# Patient Record
Sex: Female | Born: 1975 | Race: White | Hispanic: No | Marital: Married | Smoking: Never smoker
Health system: Southern US, Community
[De-identification: ages and names within clinical notes are randomized; demographics above are authoritative.]

## PROBLEM LIST (undated history)

## (undated) DIAGNOSIS — F419 Anxiety disorder, unspecified: Secondary | ICD-10-CM

## (undated) DIAGNOSIS — F329 Major depressive disorder, single episode, unspecified: Secondary | ICD-10-CM

## (undated) DIAGNOSIS — F32A Depression, unspecified: Secondary | ICD-10-CM

## (undated) DIAGNOSIS — E079 Disorder of thyroid, unspecified: Secondary | ICD-10-CM

## (undated) DIAGNOSIS — R51 Headache: Secondary | ICD-10-CM

## (undated) HISTORY — DX: Depression, unspecified: F32.A

## (undated) HISTORY — DX: Disorder of thyroid, unspecified: E07.9

## (undated) HISTORY — DX: Headache: R51

## (undated) HISTORY — DX: Anxiety disorder, unspecified: F41.9

## (undated) HISTORY — DX: Major depressive disorder, single episode, unspecified: F32.9

---

## 2013-10-01 ENCOUNTER — Encounter (HOSPITAL_COMMUNITY): Payer: Self-pay | Admitting: Psychiatry

## 2013-10-01 ENCOUNTER — Encounter (INDEPENDENT_AMBULATORY_CARE_PROVIDER_SITE_OTHER): Payer: Self-pay

## 2013-10-01 ENCOUNTER — Ambulatory Visit (INDEPENDENT_AMBULATORY_CARE_PROVIDER_SITE_OTHER): Payer: 59 | Admitting: Psychiatry

## 2013-10-01 VITALS — BP 115/86 | HR 89 | Ht 67.0 in | Wt 203.0 lb

## 2013-10-01 DIAGNOSIS — F4001 Agoraphobia with panic disorder: Secondary | ICD-10-CM | POA: Insufficient documentation

## 2013-10-01 DIAGNOSIS — F4310 Post-traumatic stress disorder, unspecified: Secondary | ICD-10-CM

## 2013-10-01 DIAGNOSIS — F331 Major depressive disorder, recurrent, moderate: Secondary | ICD-10-CM | POA: Insufficient documentation

## 2013-10-01 MED ORDER — BUSPIRONE HCL 7.5 MG PO TABS: 7.5000 mg | ORAL_TABLET | Freq: Two times a day (BID) | ORAL | Status: DC

## 2013-10-01 MED ORDER — PAROXETINE HCL 30 MG PO TABS: ORAL_TABLET | ORAL | Status: DC
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
## 2013-10-01 NOTE — Patient Instructions (Signed)
Cut down prozac to 40mg  today and discontinue in next 7 to 10 days. You will start paxil 15mg  today and increase it to 30mg  tablet in the next 7 days.  Schedule for Individual therapy for anxiety and depression.

## 2013-10-06 ENCOUNTER — Ambulatory Visit (HOSPITAL_COMMUNITY): Payer: Self-pay | Admitting: Professional Counselor

## 2013-10-20 ENCOUNTER — Ambulatory Visit (INDEPENDENT_AMBULATORY_CARE_PROVIDER_SITE_OTHER): Payer: 59 | Admitting: Professional Counselor

## 2013-10-20 DIAGNOSIS — F4310 Post-traumatic stress disorder, unspecified: Secondary | ICD-10-CM
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
## 2013-10-29 ENCOUNTER — Ambulatory Visit (INDEPENDENT_AMBULATORY_CARE_PROVIDER_SITE_OTHER): Payer: 59 | Admitting: Psychiatry

## 2013-10-29 DIAGNOSIS — F331 Major depressive disorder, recurrent, moderate: Secondary | ICD-10-CM

## 2013-10-29 DIAGNOSIS — F4001 Agoraphobia with panic disorder: Secondary | ICD-10-CM

## 2013-10-29 DIAGNOSIS — F4310 Post-traumatic stress disorder, unspecified: Secondary | ICD-10-CM

## 2013-10-29 MED ORDER — PAROXETINE HCL 30 MG PO TABS: ORAL_TABLET | ORAL | Status: DC

## 2013-10-29 MED ORDER — BUSPIRONE HCL 7.5 MG PO TABS: 7.5000 mg | ORAL_TABLET | Freq: Two times a day (BID) | ORAL | Status: DC
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
## 2013-11-10 ENCOUNTER — Encounter (INDEPENDENT_AMBULATORY_CARE_PROVIDER_SITE_OTHER): Payer: Self-pay

## 2013-11-10 ENCOUNTER — Ambulatory Visit (INDEPENDENT_AMBULATORY_CARE_PROVIDER_SITE_OTHER): Payer: 59 | Admitting: Professional Counselor

## 2013-11-10 DIAGNOSIS — F4310 Post-traumatic stress disorder, unspecified: Secondary | ICD-10-CM

## 2013-11-11 ENCOUNTER — Encounter (HOSPITAL_COMMUNITY): Payer: Self-pay | Admitting: Professional Counselor

## 2013-11-11 NOTE — Progress Notes (Signed)
   THERAPIST PROGRESS NOTE  Session Time: 3:30pm-4:00pm  Participation Level: Active  Behavioral Response: CasualAlertAnxious  Type of Therapy: Individual Therapy  Treatment Goals addressed: Anxiety  Interventions: CBT and Solution Focused  Summary: Teresa Estrada is a 38 y.o. female who presents with anxious mood due to being 30 minutes late for appointment.  She reports the school bus schedule remains inconsistent and she apologized for being late. She reports her overall last few weeks have been stable, reporting no new stressors. Her medical problems continue to be overwhelming for her, but she was able to speak to the doctor and learn about her treatment plan.  She shares she practiced being assertive with the doctor in learning about her prognosis and began asking questions which is out of the norm for patient.  Her family life has improved greatly regarding the relationship with her children and husband.  Patient report she has been practicing many of the homework assignments and doing less isolating.  She reports she is trying hard to keep her thoughts positive and not irrational thinking the worst possible scenarios.   Overall session discussed irrational thought patterns, improving positive outcomes and additional interventions to improve overall wellness.  Suicidal/Homicidal: Nowithout intent/plan  Therapist Response: Christen's overall function level with anxiety and depression were assessed and stable currently. Patient reports less isolation, tearfulness, improve motivation, and less than 1 panic attack a week. She continues to work on controlling her emotions, not living in the past, and being more positive with future oriented problems or goals.  Her motivation to change is high due to patient wanting to be more active with her daughters and less stuck in her negative thoughts.  Plan: Return again in 2 weeks.  Diagnosis: Axis I: Post Traumatic Stress Disorder    Axis  II: Deferred    Raye Sorrow, LCSW 11/11/2013

## 2013-11-19 ENCOUNTER — Encounter (HOSPITAL_COMMUNITY): Payer: Self-pay | Admitting: Professional Counselor

## 2013-11-19 ENCOUNTER — Ambulatory Visit (INDEPENDENT_AMBULATORY_CARE_PROVIDER_SITE_OTHER): Payer: 59 | Admitting: Professional Counselor

## 2013-11-19 DIAGNOSIS — F4310 Post-traumatic stress disorder, unspecified: Secondary | ICD-10-CM

## 2013-11-19 NOTE — Progress Notes (Signed)
   THERAPIST PROGRESS NOTE  Session Time: 11:00am-12:00pm  Participation Level: Active  Behavioral Response: DisheveledAlertAnxious and tearful  Type of Therapy: Individual Therapy  Treatment Goals addressed: Anxiety  Interventions: DBT and Strength-based  Summary: Teresa Estrada is a 38 y.o. female who presents with disheveled affect along with anxiety as patient was observed shaking and tearful.  She processes her emotions around her extended family in [place] and how they continue to have control over her life, emotions, and outcomes.  Patient reports how family has been affecting her marriage and overall satisfaction in life as she has irrational thoughts of feeling responsible for outcomes of her extended family or guilt that she owes something to her siblings and parents. She processes feelings of being exhausted, frustrated and wanting to make a change and reasons associated/excuses as to why things cannot change.  DBT is used to help her understand distress tolerance and start using effective rethinking along with paired relaxation in effort to regain control over her thoughts and outcomes.  She was given homework to practice labeling her thoughts that lead to distress and in effort begin re-evaluating the situation by writing down effective positive thoughts that can replace stress and negative outcomes.     Suicidal/Homicidal: Nowithout intent/plan  Therapist Response: Teresa Estrada was very anxious and shaking upon entering for session.  This was observed through her behaviors but also per report. She is very self aware of what she has to do to take control back in her life, but her traumatic past and enmeshment with family stops her from making changes.  She is able to process feelings associated with negative comments from her mother or being a burden and how that affects her life today making it difficult to find closure to her past.  Willingness to change is high, but fear  bounds her to continuing the same behaviors.  Homework given to make her more aware of behaviors and thoughts in effort to find closure with extended family and allowing them to be less likely to control her thoughts.  Plan: Return again in 1 weeks.  Diagnosis: Axis I: Post Traumatic Stress Disorder    Axis II: Deferred    Raye Sorrow, LCSW 11/19/2013

## 2013-11-24 ENCOUNTER — Ambulatory Visit (HOSPITAL_COMMUNITY): Payer: 59 | Admitting: Professional Counselor

## 2013-11-24 ENCOUNTER — Encounter (INDEPENDENT_AMBULATORY_CARE_PROVIDER_SITE_OTHER): Payer: Self-pay

## 2013-12-31 ENCOUNTER — Encounter (INDEPENDENT_AMBULATORY_CARE_PROVIDER_SITE_OTHER): Payer: Self-pay

## 2013-12-31 ENCOUNTER — Encounter (HOSPITAL_COMMUNITY): Payer: Self-pay | Admitting: Psychiatry

## 2013-12-31 ENCOUNTER — Ambulatory Visit (INDEPENDENT_AMBULATORY_CARE_PROVIDER_SITE_OTHER): Payer: 59 | Admitting: Psychiatry

## 2013-12-31 VITALS — BP 122/90 | HR 81 | Wt 196.0 lb

## 2013-12-31 DIAGNOSIS — F331 Major depressive disorder, recurrent, moderate: Secondary | ICD-10-CM

## 2013-12-31 DIAGNOSIS — F4310 Post-traumatic stress disorder, unspecified: Secondary | ICD-10-CM

## 2013-12-31 DIAGNOSIS — F4001 Agoraphobia with panic disorder: Secondary | ICD-10-CM

## 2013-12-31 MED ORDER — PAROXETINE HCL 30 MG PO TABS: ORAL_TABLET | ORAL | Status: DC

## 2013-12-31 MED ORDER — BUSPIRONE HCL 7.5 MG PO TABS: ORAL_TABLET | ORAL | Status: DC
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
## 2014-03-02 ENCOUNTER — Ambulatory Visit (HOSPITAL_COMMUNITY): Payer: Self-pay | Admitting: Psychiatry

## 2014-03-16 ENCOUNTER — Other Ambulatory Visit (HOSPITAL_COMMUNITY): Payer: Self-pay | Admitting: Psychiatry

## 2014-03-23 ENCOUNTER — Encounter (INDEPENDENT_AMBULATORY_CARE_PROVIDER_SITE_OTHER): Payer: Self-pay

## 2014-03-23 ENCOUNTER — Encounter (HOSPITAL_COMMUNITY): Payer: Self-pay | Admitting: Psychiatry

## 2014-03-23 ENCOUNTER — Ambulatory Visit (INDEPENDENT_AMBULATORY_CARE_PROVIDER_SITE_OTHER): Payer: 59 | Admitting: Psychiatry

## 2014-03-23 VITALS — BP 113/83 | HR 100 | Ht 67.0 in | Wt 209.0 lb

## 2014-03-23 DIAGNOSIS — F4001 Agoraphobia with panic disorder: Secondary | ICD-10-CM

## 2014-03-23 DIAGNOSIS — F4310 Post-traumatic stress disorder, unspecified: Secondary | ICD-10-CM

## 2014-03-23 DIAGNOSIS — F331 Major depressive disorder, recurrent, moderate: Secondary | ICD-10-CM

## 2014-03-23 MED ORDER — BUSPIRONE HCL 7.5 MG PO TABS: ORAL_TABLET | ORAL | Status: DC

## 2014-03-23 MED ORDER — PAROXETINE HCL 30 MG PO TABS: ORAL_TABLET | ORAL | Status: DC
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
## 2014-04-20 ENCOUNTER — Other Ambulatory Visit (HOSPITAL_COMMUNITY): Payer: Self-pay | Admitting: Psychiatry

## 2014-05-24 ENCOUNTER — Ambulatory Visit (HOSPITAL_COMMUNITY): Payer: Self-pay | Admitting: Psychiatry

## 2014-05-31 ENCOUNTER — Encounter (INDEPENDENT_AMBULATORY_CARE_PROVIDER_SITE_OTHER): Payer: Self-pay

## 2014-05-31 ENCOUNTER — Encounter (HOSPITAL_COMMUNITY): Payer: Self-pay | Admitting: Psychiatry

## 2014-05-31 ENCOUNTER — Ambulatory Visit (INDEPENDENT_AMBULATORY_CARE_PROVIDER_SITE_OTHER): Payer: 59 | Admitting: Psychiatry

## 2014-05-31 VITALS — BP 139/84 | HR 104 | Ht 67.0 in | Wt 211.0 lb

## 2014-05-31 DIAGNOSIS — F331 Major depressive disorder, recurrent, moderate: Secondary | ICD-10-CM

## 2014-05-31 DIAGNOSIS — F4310 Post-traumatic stress disorder, unspecified: Secondary | ICD-10-CM

## 2014-05-31 DIAGNOSIS — F4001 Agoraphobia with panic disorder: Secondary | ICD-10-CM

## 2014-05-31 MED ORDER — PAROXETINE HCL 40 MG PO TABS: ORAL_TABLET | ORAL | Status: DC

## 2014-05-31 MED ORDER — BUSPIRONE HCL 10 MG PO TABS: ORAL_TABLET | ORAL | Status: DC

## 2014-05-31 MED ORDER — CLONAZEPAM 0.5 MG PO TABS
0.5000 mg | ORAL_TABLET | Freq: Every day | ORAL | Status: DC | PRN
Start: 2014-05-31 — End: 2014-06-21
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
## 2014-06-21 ENCOUNTER — Encounter (HOSPITAL_COMMUNITY): Payer: Self-pay | Admitting: Psychiatry

## 2014-06-21 ENCOUNTER — Ambulatory Visit (INDEPENDENT_AMBULATORY_CARE_PROVIDER_SITE_OTHER): Payer: 59 | Admitting: Psychiatry

## 2014-06-21 VITALS — BP 124/89 | HR 86 | Ht 67.0 in | Wt 208.0 lb

## 2014-06-21 DIAGNOSIS — F331 Major depressive disorder, recurrent, moderate: Secondary | ICD-10-CM | POA: Diagnosis not present

## 2014-06-21 DIAGNOSIS — F4001 Agoraphobia with panic disorder: Secondary | ICD-10-CM | POA: Diagnosis not present

## 2014-06-21 DIAGNOSIS — F4310 Post-traumatic stress disorder, unspecified: Secondary | ICD-10-CM | POA: Diagnosis not present

## 2014-06-21 MED ORDER — CLONAZEPAM 0.5 MG PO TABS: 0.5000 mg | ORAL_TABLET | Freq: Every day | ORAL | Status: DC | PRN

## 2014-06-21 MED ORDER — PAROXETINE HCL 40 MG PO TABS: ORAL_TABLET | ORAL | Status: DC

## 2014-06-21 MED ORDER — BUSPIRONE HCL 10 MG PO TABS: ORAL_TABLET | ORAL | Status: DC
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
## 2014-07-22 ENCOUNTER — Ambulatory Visit (HOSPITAL_COMMUNITY): Payer: Self-pay | Admitting: Psychiatry

## 2014-07-23 ENCOUNTER — Ambulatory Visit (HOSPITAL_COMMUNITY): Payer: Self-pay | Admitting: Licensed Clinical Social Worker

## 2014-07-30 ENCOUNTER — Ambulatory Visit (INDEPENDENT_AMBULATORY_CARE_PROVIDER_SITE_OTHER): Payer: 59 | Admitting: Psychiatry

## 2014-07-30 ENCOUNTER — Encounter (HOSPITAL_COMMUNITY): Payer: Self-pay | Admitting: Psychiatry

## 2014-07-30 VITALS — Ht 67.0 in | Wt 208.0 lb

## 2014-07-30 DIAGNOSIS — F4310 Post-traumatic stress disorder, unspecified: Secondary | ICD-10-CM | POA: Diagnosis not present

## 2014-07-30 DIAGNOSIS — F4001 Agoraphobia with panic disorder: Secondary | ICD-10-CM

## 2014-07-30 DIAGNOSIS — F331 Major depressive disorder, recurrent, moderate: Secondary | ICD-10-CM | POA: Diagnosis not present

## 2014-07-30 MED ORDER — CLONAZEPAM 0.5 MG PO TABS: 0.5000 mg | ORAL_TABLET | Freq: Every day | ORAL | Status: DC | PRN

## 2014-07-30 MED ORDER — PAROXETINE HCL 40 MG PO TABS: ORAL_TABLET | ORAL | Status: DC

## 2014-07-30 MED ORDER — BUSPIRONE HCL 10 MG PO TABS: ORAL_TABLET | ORAL | Status: DC
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
## 2014-08-26 ENCOUNTER — Encounter (HOSPITAL_COMMUNITY): Payer: Self-pay | Admitting: Licensed Clinical Social Worker

## 2014-08-26 ENCOUNTER — Ambulatory Visit (INDEPENDENT_AMBULATORY_CARE_PROVIDER_SITE_OTHER): Payer: 59 | Admitting: Licensed Clinical Social Worker

## 2014-08-26 DIAGNOSIS — F4310 Post-traumatic stress disorder, unspecified: Secondary | ICD-10-CM

## 2014-08-26 DIAGNOSIS — F411 Generalized anxiety disorder: Secondary | ICD-10-CM | POA: Insufficient documentation

## 2014-08-26 DIAGNOSIS — F410 Panic disorder [episodic paroxysmal anxiety] without agoraphobia: Secondary | ICD-10-CM | POA: Insufficient documentation

## 2014-08-26 DIAGNOSIS — F331 Major depressive disorder, recurrent, moderate: Secondary | ICD-10-CM | POA: Diagnosis not present
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
## 2014-08-30 ENCOUNTER — Ambulatory Visit (INDEPENDENT_AMBULATORY_CARE_PROVIDER_SITE_OTHER): Payer: 59 | Admitting: Psychiatry

## 2014-08-30 ENCOUNTER — Encounter (HOSPITAL_COMMUNITY): Payer: Self-pay | Admitting: Psychiatry

## 2014-08-30 VITALS — BP 110/80 | HR 80 | Ht 67.0 in | Wt 208.0 lb

## 2014-08-30 DIAGNOSIS — F410 Panic disorder [episodic paroxysmal anxiety] without agoraphobia: Secondary | ICD-10-CM

## 2014-08-30 DIAGNOSIS — F331 Major depressive disorder, recurrent, moderate: Secondary | ICD-10-CM | POA: Diagnosis not present

## 2014-08-30 DIAGNOSIS — F4310 Post-traumatic stress disorder, unspecified: Secondary | ICD-10-CM | POA: Diagnosis not present

## 2014-08-30 DIAGNOSIS — F411 Generalized anxiety disorder: Secondary | ICD-10-CM

## 2014-08-30 DIAGNOSIS — F4001 Agoraphobia with panic disorder: Secondary | ICD-10-CM | POA: Diagnosis not present

## 2014-08-30 MED ORDER — PAROXETINE HCL 40 MG PO TABS: ORAL_TABLET | ORAL | Status: DC

## 2014-08-30 MED ORDER — CLONAZEPAM 0.5 MG PO TABS: 0.5000 mg | ORAL_TABLET | Freq: Every day | ORAL | Status: DC | PRN

## 2014-08-30 MED ORDER — BUSPIRONE HCL 10 MG PO TABS: ORAL_TABLET | ORAL | Status: DC
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
## 2014-08-31 ENCOUNTER — Other Ambulatory Visit (HOSPITAL_COMMUNITY): Payer: Self-pay | Admitting: Psychiatry

## 2014-09-02 NOTE — Telephone Encounter (Signed)
Rx for Paxil 40mg   was sent to pharmacy on 6/13.

## 2014-10-06 ENCOUNTER — Ambulatory Visit (HOSPITAL_COMMUNITY): Payer: Self-pay | Admitting: Licensed Clinical Social Worker

## 2014-10-11 ENCOUNTER — Ambulatory Visit (HOSPITAL_COMMUNITY): Payer: Self-pay | Admitting: Psychiatry

## 2014-10-21 ENCOUNTER — Ambulatory Visit (INDEPENDENT_AMBULATORY_CARE_PROVIDER_SITE_OTHER): Payer: 59 | Admitting: Licensed Clinical Social Worker

## 2014-10-21 DIAGNOSIS — F411 Generalized anxiety disorder: Secondary | ICD-10-CM | POA: Diagnosis not present

## 2014-10-21 DIAGNOSIS — F331 Major depressive disorder, recurrent, moderate: Secondary | ICD-10-CM | POA: Diagnosis not present

## 2014-10-21 DIAGNOSIS — F410 Panic disorder [episodic paroxysmal anxiety] without agoraphobia: Secondary | ICD-10-CM

## 2014-10-21 DIAGNOSIS — F4310 Post-traumatic stress disorder, unspecified: Secondary | ICD-10-CM

## 2014-10-21 NOTE — Progress Notes (Signed)
   THERAPIST PROGRESS NOTE  Session Time: 9:00-10:05am  Participation Level: Active  Behavioral Response: CasualAlertAnxious and Tearful   Type of Therapy: Individual Therapy  Treatment Goals addressed: coping with anxiety  Interventions: psycho-education about effects of trauma   Suicidal/Homicidal: Denied both  Therapist Interventions: Had patient fill out a PHQ-9 and GAD-7 to assess for depression and anxiety.   Discussed recent stressors which have interfered with being able to come to therapy sessions. Learned details about patient's history with her family.   Emphasized that when you go through stressful or traumatic situations it is normal to have distressing emotions.  Summary: PHQ-9 score was 16 (moderately severe).  This is a slight improvement compared to her score in early June. GAD-7 score was 16 (severe).  This is also a slight improvement.     Reported she was unable to attend appointments last month because she had to go to [place] to be with family.  Her grandfather was ill and shortly after her uncle died.   Talked about how she has always been deeply connected with her family despite their dysfunctional behavior.  Explained how she feels a tremendous amount of guilt if she is not there for her family.  Others have encouraged her to cut ties with them, but she places a lot of value on family.   Indicated she appreciated having an opportunity to share her thoughts and feelings about different events in her life and have someone simply listen to what she had to say.   Agreed that validation is something she needs from loved ones but often doesn't receive.          Plan: Return again in approximately 2 weeks.    Diagnosis: F41.1 Generalized Anxiety Disorder                     F33.1 Major Depressive Disorder, recurrent, moderate                     F43.1 PTSD                     F41.0  Panic Disorder     Marilu Favre, LCSW 10/21/2014

## 2014-10-29 ENCOUNTER — Ambulatory Visit (INDEPENDENT_AMBULATORY_CARE_PROVIDER_SITE_OTHER): Payer: 59 | Admitting: Psychiatry

## 2014-10-29 ENCOUNTER — Encounter (HOSPITAL_COMMUNITY): Payer: Self-pay | Admitting: Psychiatry

## 2014-10-29 VITALS — BP 128/82 | HR 90 | Ht 67.0 in | Wt 208.0 lb

## 2014-10-29 DIAGNOSIS — F4001 Agoraphobia with panic disorder: Secondary | ICD-10-CM | POA: Diagnosis not present

## 2014-10-29 DIAGNOSIS — F410 Panic disorder [episodic paroxysmal anxiety] without agoraphobia: Secondary | ICD-10-CM

## 2014-10-29 DIAGNOSIS — F331 Major depressive disorder, recurrent, moderate: Secondary | ICD-10-CM | POA: Diagnosis not present

## 2014-10-29 DIAGNOSIS — F4310 Post-traumatic stress disorder, unspecified: Secondary | ICD-10-CM

## 2014-10-29 DIAGNOSIS — F411 Generalized anxiety disorder: Secondary | ICD-10-CM

## 2014-10-29 MED ORDER — CLONAZEPAM 0.5 MG PO TABS: 0.5000 mg | ORAL_TABLET | Freq: Every day | ORAL | Status: DC | PRN

## 2014-10-29 MED ORDER — BUSPIRONE HCL 10 MG PO TABS: ORAL_TABLET | ORAL | Status: DC

## 2014-10-29 MED ORDER — PAROXETINE HCL 40 MG PO TABS: ORAL_TABLET | ORAL | Status: DC
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
## 2014-11-03 ENCOUNTER — Telehealth (HOSPITAL_COMMUNITY): Payer: Self-pay | Admitting: *Deleted

## 2014-11-03 MED ORDER — BUSPIRONE HCL 10 MG PO TABS: ORAL_TABLET | ORAL | Status: DC

## 2014-11-03 MED ORDER — PAROXETINE HCL 40 MG PO TABS: ORAL_TABLET | ORAL | Status: DC

## 2014-11-03 NOTE — Telephone Encounter (Signed)
Received fax from [facility] for refills for Buspar  and Paxil . Per Dr. Gilmore Laroche, pt is authorized for a refill for Buspar , Qty 90 w/ 1 additional refill and Paxil , Qty 30 with 1 additional refill. Prescriptions were resubmitted to [facility] ([address]) PT has a f/u appt on 12/28/14.

## 2014-11-04 ENCOUNTER — Ambulatory Visit (HOSPITAL_COMMUNITY): Payer: Self-pay | Admitting: Licensed Clinical Social Worker

## 2014-11-17 ENCOUNTER — Encounter: Payer: Self-pay | Admitting: Emergency Medicine

## 2014-11-17 ENCOUNTER — Emergency Department (INDEPENDENT_AMBULATORY_CARE_PROVIDER_SITE_OTHER)
Admission: EM | Admit: 2014-11-17 | Discharge: 2014-11-17 | Disposition: A | Discharge status: Home / Self Care | Payer: 59 | Source: Home / Self Care | Attending: Family Medicine | Admitting: Family Medicine

## 2014-11-17 ENCOUNTER — Emergency Department (INDEPENDENT_AMBULATORY_CARE_PROVIDER_SITE_OTHER): Payer: 59

## 2014-11-17 DIAGNOSIS — M25532 Pain in left wrist: Secondary | ICD-10-CM

## 2014-11-17 DIAGNOSIS — W108XXA Fall (on) (from) other stairs and steps, initial encounter: Secondary | ICD-10-CM

## 2014-11-17 DIAGNOSIS — M79642 Pain in left hand: Secondary | ICD-10-CM | POA: Diagnosis not present

## 2014-11-17 DIAGNOSIS — M542 Cervicalgia: Secondary | ICD-10-CM

## 2014-11-17 MED ORDER — IBUPROFEN 600 MG PO TABS: 600.0000 mg | ORAL_TABLET | Freq: Four times a day (QID) | ORAL | Status: DC | PRN

## 2014-11-17 MED ORDER — HYDROCODONE-ACETAMINOPHEN 5-325 MG PO TABS: 1.0000 | ORAL_TABLET | Freq: Four times a day (QID) | ORAL | Status: DC | PRN

## 2014-11-17 NOTE — Discharge Instructions (Signed)
Vicodin (hydrocodone-acetaminophen) is a narcotic pain medication, do not combine these medications with others containing tylenol. While taking, do not drink alcohol, drive, or perform any other activities that requires focus while taking these medications.  ° ° °

## 2014-11-17 NOTE — ED Provider Notes (Signed)
CSN: [id number]     Arrival date & time 11/17/14  1719 History   First MD Initiated Contact with Patient 11/17/14 1722     Chief Complaint  Patient presents with  . Arm Pain   (Consider location/radiation/quality/duration/timing/severity/associated sxs/prior Treatment) HPI  Pt is a 39yo female presenting to [facility] with c/o Left sided neck pain and Left wrist and hand pain after slip and fall down carpet steps in her house about 30 minutes PTA. Pt states she did hit her head but denies LOC. Denies change in vision, balance or nausea. Pt c/o severe Left wrist pain that is worse with movement and palpation. Pt also c/o Left sided neck pain that is aching and sore.  Denies numbness or tingling in arms or legs. No pain medication taken PTA. Pain has gradually worsened since onset. Denies back pain. Denies any other injuries.  Past Medical History  Diagnosis Date  . Anxiety   . Depression   . Thyroid disease   . Headache(784.0)    History reviewed. No pertinent past surgical history. Family History  Problem Relation Age of Onset  . Depression Father   . Alcohol abuse Father   . Anxiety disorder Paternal Aunt   . Depression Paternal Aunt    Social History  Substance Use Topics  . Smoking status: Never Smoker   . Smokeless tobacco: None     Comment: NA  . Alcohol Use: No   OB History    No data available     Review of Systems  Cardiovascular: Negative for chest pain and palpitations.  Gastrointestinal: Negative for nausea, vomiting and abdominal pain.  Musculoskeletal: Positive for myalgias, arthralgias and neck pain. Negative for back pain, joint swelling and neck stiffness.  Skin: Negative for color change and wound.  Neurological: Negative for weakness and numbness.    Allergies  Review of patient's allergies indicates no known allergies.  Home Medications   Prior to Admission medications   Medication Sig Start Date End Date Taking? Authorizing Provider  clonazePAM  (KLONOPIN) 0.5 MG tablet Take 1 tablet (0.5 mg total) by mouth daily as needed for anxiety. 10/29/14   [name], MD  HYDROcodone-acetaminophen (NORCO/VICODIN) 5-325 MG per tablet Take 1-2 tablets by mouth every 6 (six) hours as needed for moderate pain or severe pain. 11/17/14   Junius Finner, PA-C  ibuprofen (ADVIL,MOTRIN) 600 MG tablet Take 1 tablet (600 mg total) by mouth every 6 (six) hours as needed. 11/17/14   Junius Finner, PA-C  levothyroxine (SYNTHROID, LEVOTHROID) 100 MCG tablet  09/30/13   Historical Provider, MD  PARoxetine (PAXIL) 40 MG tablet Take one tablet mid day. 11/03/14   [name], MD   Meds Ordered and Administered this Visit  Medications - No data to display  BP 133/94 mmHg  Pulse 89  Temp(Src) 98.3 F (36.8 C) (Oral)  Ht 5\' 6"  (1.676 m)  Wt 215 lb (97.523 kg)  BMI 34.72 kg/m2  SpO2 98%  LMP 10/25/2014 (Exact Date) No data found.   Physical Exam  Constitutional: She is oriented to person, place, and time. She appears well-developed and well-nourished.  HENT:  Head: Normocephalic and atraumatic.  Eyes: EOM are normal.  Neck: Normal range of motion. Neck supple.  No midline bone tenderness, no crepitus or step-offs.  Mild tenderness to muscles on Left lateral side of neck.  Cardiovascular: Normal rate.   Pulses:      Radial pulses are 2+ on the left side.  Pulmonary/Chest: Effort normal.  Musculoskeletal: She  exhibits tenderness. She exhibits no edema.  Left shoulder and elbow: no deformity, FROM w/o pain. Left wrist: no deformity or edema, tenderness to radial aspect a base of thumb into snuffbox and dorsal radial aspect of wrist. Limited flexion and extension of wrist due to pain. FROM all digits. Able to oppose thumb and little finger.   No midline spinal tenderness FROM Right arm and bilateral lower extremities with 5/5 strength.  Neurological: She is alert and oriented to person, place, and time.  Sensation normal in upper and lower extremities  bilaterally.  Skin: Skin is warm and dry.  Left hand and wrist: skin in tact, no ecchymosis or erythema  Psychiatric: She has a normal mood and affect. Her behavior is normal.  Nursing note and vitals reviewed.   ED Course  Procedures    Fiberglass thumb spica wrist splint applied to Left splint by this provided. Fingers and Left hand neurovascularly in tact before and after splint placement.   Labs Review Labs Reviewed - No data to display  Imaging Review Dg Wrist Complete Left  11/17/2014   CLINICAL DATA:  Fall down stairs. Left wrist injury and pain. Initial encounter.  EXAM: LEFT WRIST - COMPLETE 3+ VIEW  COMPARISON:  None.  FINDINGS: There is no evidence of fracture or dislocation. There is no evidence of arthropathy or other focal bone abnormality. Soft tissues are unremarkable.  IMPRESSION: Negative.   Electronically Signed   By: Myles Rosenthal M.D.   On: 11/17/2014 18:01   Dg Hand Complete Left  11/17/2014   CLINICAL DATA:  Left hand and wrist pain after falling down stairs.  EXAM: LEFT HAND - COMPLETE 3+ VIEW  COMPARISON:  None.  FINDINGS: There is no evidence of fracture or dislocation. There is no evidence of arthropathy or other focal bone abnormality. Soft tissues are unremarkable.  IMPRESSION: Negative.   Electronically Signed   By: Elberta Fortis M.D.   On: 11/17/2014 18:01       MDM   1. Left wrist pain   2. Fall down stairs, initial encounter   3. Neck pain on left side    Pt c/o Left sided neck pain and Left hand and wrist pain after fall down stairs PTA. No red flag symptoms. No spinal tenderness.  Left hand is neurovascularly in tact, however, tenderness present in anatomic snuff box and severe pain with flexion and extension of wrist. Plain films negative for fracture or dislocation. Concern for occult scaphoid fracture given pt's pain. Pt placed in thumb spica splint. Advised to call Dr. Denyse Amass, Sports Medicine, to follow up in 1 week for recheck of symptoms.   Home care instructions provided. Rx: vicodin and ibuprofen. Patient counseled on use of narcotic pain medications. Counseled not to combine these medications with others containing tylenol. Urged not to drink alcohol, drive, or perform any other activities that requires focus while taking these medications.  Patient verbalized understanding and agreement with treatment plan.     Junius Finner, PA-C 11/17/14 [phone]

## 2014-11-17 NOTE — ED Notes (Signed)
Pt c/o left arm, shoulder, wrist pain today, fell down the steps today.

## 2014-11-18 ENCOUNTER — Ambulatory Visit (INDEPENDENT_AMBULATORY_CARE_PROVIDER_SITE_OTHER): Payer: 59 | Admitting: Licensed Clinical Social Worker

## 2014-11-18 DIAGNOSIS — F331 Major depressive disorder, recurrent, moderate: Secondary | ICD-10-CM | POA: Diagnosis not present

## 2014-11-18 DIAGNOSIS — F410 Panic disorder [episodic paroxysmal anxiety] without agoraphobia: Secondary | ICD-10-CM

## 2014-11-18 DIAGNOSIS — F411 Generalized anxiety disorder: Secondary | ICD-10-CM | POA: Diagnosis not present

## 2014-11-18 DIAGNOSIS — F4310 Post-traumatic stress disorder, unspecified: Secondary | ICD-10-CM

## 2014-11-19 NOTE — Progress Notes (Signed)
   THERAPIST PROGRESS NOTE  Session Time: 3:00pm-4:00pm  Participation Level: Active  Behavioral Response: CasualAlertAnxious and Tearful   Type of Therapy: Individual Therapy  Treatment Goals addressed: coping with anxiety  Interventions: psychoeducation about codependency  Suicidal/Homicidal: Denied both  Therapist Interventions: Explored the notion that patient is co-dependent. Shared the title of a book called Codependent No More. Reviewed some common characteristics of people who are codependent.   Summary: Apologized for having to miss her last therapy appointment.  Explained that she went up to [place] to attend to a crisis with her father and his family.  Noted that he was not appreciative of her efforts to help.  Said "If my dad wasn't here I wouldn't deal with the rest of the family."  Explained that dad has no income, lives in a camper, and is dependent on alcohol.   Reported she could relate to the majority of characteristics of individuals who are codependent.     Her anxiety levels have remained high.  Indicated she ruminates often.      Plan: Return again in approximately 2 weeks.  Will introduce mindfulness    Diagnosis: F41.1 Generalized Anxiety Disorder                     F33.1 Major Depressive Disorder, recurrent, moderate                     F43.1 PTSD                     F41.0  Panic Disorder     Marilu Favre, LCSW 11/18/14

## 2014-11-20 ENCOUNTER — Telehealth: Payer: Self-pay | Admitting: Emergency Medicine

## 2014-12-02 ENCOUNTER — Ambulatory Visit (HOSPITAL_COMMUNITY): Payer: Self-pay | Admitting: Licensed Clinical Social Worker

## 2014-12-16 ENCOUNTER — Ambulatory Visit (INDEPENDENT_AMBULATORY_CARE_PROVIDER_SITE_OTHER): Payer: 59 | Admitting: Licensed Clinical Social Worker

## 2014-12-16 DIAGNOSIS — F411 Generalized anxiety disorder: Secondary | ICD-10-CM

## 2014-12-16 DIAGNOSIS — F4310 Post-traumatic stress disorder, unspecified: Secondary | ICD-10-CM | POA: Diagnosis not present

## 2014-12-16 DIAGNOSIS — F331 Major depressive disorder, recurrent, moderate: Secondary | ICD-10-CM | POA: Diagnosis not present

## 2014-12-16 DIAGNOSIS — F410 Panic disorder [episodic paroxysmal anxiety] without agoraphobia: Secondary | ICD-10-CM | POA: Diagnosis not present

## 2014-12-16 NOTE — Progress Notes (Signed)
   THERAPIST PROGRESS NOTE  Session Time: 3:00pm-4:00pm  Participation Level: Active  Behavioral Response: CasualAlertAnxious    Type of Therapy: Individual Therapy  Treatment Goals addressed: coping with anxiety  Interventions: Mindfulness  Suicidal/Homicidal: Denied both  Therapist Interventions: Introduced the concept of mindfulness.  Emphasized how learning to focus on the present can help you to feel more in control of your emotions.  Explained how it can be useful to practice at times when you catch yourself having unhelpful thoughts.  Described how you can choose to do tasks in a mindful way.  Guided patient through practicing mindfulness in different ways including focusing on an object and mindful breathing.  Encouraged patient to practice the skills regularly in addition to times of distress.   Summary:  Reported that daughter is having to deal with some significant health issues.  Also concerned about husband's health.  These issues have distracted her from extended family drama.  Indicated she has been ruminating a lot though. Seemed to understand concepts discussed.  Indicated that she intends to practice the skills.  Noted that she is planning on joining a yoga class.         Plan: Scheduled to return in approximately 2 weeks.  Will continue with mindfulness.  Diagnosis: F41.1 Generalized Anxiety Disorder                     F33.1 Major Depressive Disorder, recurrent, moderate                     F43.1 PTSD                     F41.0  Panic Disorder     Marilu Favre, LCSW 12/16/14

## 2014-12-28 ENCOUNTER — Ambulatory Visit (HOSPITAL_COMMUNITY): Payer: Self-pay | Admitting: Psychiatry

## 2014-12-30 ENCOUNTER — Ambulatory Visit (HOSPITAL_COMMUNITY): Payer: Self-pay | Admitting: Licensed Clinical Social Worker

## 2014-12-31 ENCOUNTER — Encounter (HOSPITAL_COMMUNITY): Payer: Self-pay | Admitting: Psychiatry

## 2014-12-31 ENCOUNTER — Ambulatory Visit (INDEPENDENT_AMBULATORY_CARE_PROVIDER_SITE_OTHER): Payer: 59 | Admitting: Psychiatry

## 2014-12-31 VITALS — BP 124/60 | HR 78 | Ht 67.0 in | Wt 212.0 lb

## 2014-12-31 DIAGNOSIS — F4310 Post-traumatic stress disorder, unspecified: Secondary | ICD-10-CM

## 2014-12-31 DIAGNOSIS — F411 Generalized anxiety disorder: Secondary | ICD-10-CM

## 2014-12-31 DIAGNOSIS — F410 Panic disorder [episodic paroxysmal anxiety] without agoraphobia: Secondary | ICD-10-CM

## 2014-12-31 DIAGNOSIS — F331 Major depressive disorder, recurrent, moderate: Secondary | ICD-10-CM

## 2014-12-31 MED ORDER — BUSPIRONE HCL 10 MG PO TABS: ORAL_TABLET | ORAL | Status: DC

## 2014-12-31 MED ORDER — CLONAZEPAM 0.5 MG PO TABS: 0.5000 mg | ORAL_TABLET | Freq: Every day | ORAL | Status: DC | PRN

## 2014-12-31 MED ORDER — PAROXETINE HCL 40 MG PO TABS: ORAL_TABLET | ORAL | Status: DC
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
## 2015-01-13 ENCOUNTER — Ambulatory Visit (HOSPITAL_COMMUNITY): Payer: Self-pay | Admitting: Licensed Clinical Social Worker

## 2015-01-18 ENCOUNTER — Ambulatory Visit (INDEPENDENT_AMBULATORY_CARE_PROVIDER_SITE_OTHER): Payer: 59 | Admitting: Licensed Clinical Social Worker

## 2015-01-18 DIAGNOSIS — F411 Generalized anxiety disorder: Secondary | ICD-10-CM | POA: Diagnosis not present

## 2015-01-18 DIAGNOSIS — F4310 Post-traumatic stress disorder, unspecified: Secondary | ICD-10-CM | POA: Diagnosis not present

## 2015-01-18 DIAGNOSIS — F331 Major depressive disorder, recurrent, moderate: Secondary | ICD-10-CM | POA: Diagnosis not present

## 2015-01-18 NOTE — Progress Notes (Signed)
   THERAPIST PROGRESS NOTE  Session Time: 3:00pm-4:00pm  Participation Level: Active  Behavioral Response: CasualAlertAnxious    Type of Therapy: Individual Therapy  Treatment Goals addressed: coping with anxiety  Interventions:  Acceptance and Commitment therapy  Suicidal/Homicidal: Denied both  Therapist Interventions: Discussed values patient believes to be important and how they differ from that of her husband.  Encouraged her not to look at values as being good or bad, but a reflection of your upbringing.  Discussed how as you get older you are able to pick and choose among the values you were taught which ones you believe in.   Validated frustration over feeling as though she needs to explain her values.    Summary:  Identified family cohesion as one of her values.  Also talked about how she wants her children to learn that they need to work hard in order to achieve goals.  Indicated that her husband's upbringing was different than hers because his parents were able to provide him with whatever he needed and she had to learn to make do with fewer resources.            Noted that she has been thinking about mindfulness and practicing it some.  Showed therapist a book her daughter bought her about mindfulness.  She said, "It seems to help even if just for a few moments."   Indicated that overall she has been satisfied with how she has been coping with anxiety.      Plan: Planning to schedule next therapy appointment within the next couple of weeks.  Diagnosis: F41.1 Generalized Anxiety Disorder                     F33.1 Major Depressive Disorder, recurrent, moderate                     F43.1 PTSD                     F41.0  Panic Disorder     Marilu FavreSolomon, Dameian Crisman A, LCSW 01/18/15

## 2015-02-07 ENCOUNTER — Ambulatory Visit (HOSPITAL_COMMUNITY): Payer: 59 | Admitting: Licensed Clinical Social Worker

## 2015-03-01 ENCOUNTER — Ambulatory Visit (HOSPITAL_COMMUNITY): Payer: 59 | Admitting: Licensed Clinical Social Worker

## 2015-03-03 ENCOUNTER — Ambulatory Visit (HOSPITAL_COMMUNITY): Payer: 59 | Admitting: Psychiatry

## 2015-03-03 ENCOUNTER — Other Ambulatory Visit (HOSPITAL_COMMUNITY): Payer: Self-pay | Admitting: Psychiatry

## 2015-03-07 NOTE — Telephone Encounter (Signed)
Received medication request for Paxil 40mg  from [facility]. Per Dr. Gilmore LarocheAkhtar, pt is authorized for a refill for Paxil 40mg , #30. Prescription was sent to pharmacy. Pt has a f/u appt on 03/24/15. Called and informed pt of prescription status. Pt verbalizes understanding.

## 2015-03-19 ENCOUNTER — Other Ambulatory Visit (HOSPITAL_COMMUNITY): Payer: Self-pay | Admitting: Psychiatry

## 2015-03-24 ENCOUNTER — Ambulatory Visit (INDEPENDENT_AMBULATORY_CARE_PROVIDER_SITE_OTHER): Payer: 59 | Admitting: Psychiatry

## 2015-03-24 ENCOUNTER — Encounter (HOSPITAL_COMMUNITY): Payer: Self-pay | Admitting: Psychiatry

## 2015-03-24 VITALS — BP 126/80 | HR 100 | Ht 67.0 in | Wt 209.0 lb

## 2015-03-24 DIAGNOSIS — F331 Major depressive disorder, recurrent, moderate: Secondary | ICD-10-CM

## 2015-03-24 DIAGNOSIS — F411 Generalized anxiety disorder: Secondary | ICD-10-CM | POA: Diagnosis not present

## 2015-03-24 DIAGNOSIS — F4001 Agoraphobia with panic disorder: Secondary | ICD-10-CM

## 2015-03-24 DIAGNOSIS — F4310 Post-traumatic stress disorder, unspecified: Secondary | ICD-10-CM | POA: Diagnosis not present

## 2015-03-24 MED ORDER — PAROXETINE HCL 40 MG PO TABS: 60.0000 mg | ORAL_TABLET | Freq: Every day | ORAL | Status: DC

## 2015-03-24 MED ORDER — CLONAZEPAM 0.5 MG PO TABS: 0.5000 mg | ORAL_TABLET | Freq: Every day | ORAL | Status: DC | PRN

## 2015-03-24 MED ORDER — BUSPIRONE HCL 10 MG PO TABS: ORAL_TABLET | ORAL | Status: DC
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
## 2015-04-19 ENCOUNTER — Ambulatory Visit (INDEPENDENT_AMBULATORY_CARE_PROVIDER_SITE_OTHER): Payer: 59 | Admitting: Licensed Clinical Social Worker

## 2015-04-19 DIAGNOSIS — F411 Generalized anxiety disorder: Secondary | ICD-10-CM

## 2015-04-19 DIAGNOSIS — F331 Major depressive disorder, recurrent, moderate: Secondary | ICD-10-CM | POA: Diagnosis not present

## 2015-04-19 DIAGNOSIS — F4310 Post-traumatic stress disorder, unspecified: Secondary | ICD-10-CM

## 2015-04-20 NOTE — Progress Notes (Signed)
   THERAPIST PROGRESS NOTE  Session Time: 4:10pm-5:20pm  Participation Level: Active  Behavioral Response: CasualAlert Tearful   Type of Therapy: Individual Therapy  Treatment Goals addressed: coping with anxiety  Interventions:  Treatment planning and review of progress  Suicidal/Homicidal: Denied both  Therapist Interventions:   Gathered information about significant events and changes in mood and functioning since last seen for therapy in November. Reviewed patient's treatment plan. Determined patient has made some progress with increasing her satisfaction with how she is coping with symptoms of anxiety, but she continues to feel overwhelmed a great deal of the time. Collaborated with patient to develop a new treatment goal.   Discussed the need for patient to make her psychotherapy appointments a priority if she wants to make any lasting progress.  Summary: Explained that she has not been participating in therapy because of prioritizing other things like work and caring for her family, both her immediate family and those living hours away. Expressed a belief that she has made some progress with coping with feelings of anxiety. Said "I'm allowing myself to have anxiety and not hide it." Reported being more upfront with others about how she is feeling. Indicated she still feels of great need to take responsibility for others. Explained that this stems from guilt about events in childhood.  Said she would like this pattern of behavior to change. Agreed to a new treatment goal to report taking time to focus on herself and saying no to others without feeling guilty. Agreed that she does need to prioritize her therapy sessions. Plans to schedule several in the near future.     Plan: May introduce Skills Training in Affective and Interpersonal Regulation (STAIR).   Diagnosis: F41.1 Generalized Anxiety Disorder                     F33.1 Major Depressive Disorder, recurrent, moderate                F43.1 PTSD                     F41.0  Panic Disorder     Marilu Favre, LCSW 04/19/15

## 2015-04-27 ENCOUNTER — Other Ambulatory Visit (HOSPITAL_COMMUNITY): Payer: Self-pay | Admitting: Psychiatry

## 2015-05-06 ENCOUNTER — Telehealth (HOSPITAL_COMMUNITY): Payer: Self-pay | Admitting: *Deleted

## 2015-05-06 MED ORDER — CLONAZEPAM 0.5 MG PO TABS: 0.5000 mg | ORAL_TABLET | Freq: Every day | ORAL | Status: DC | PRN

## 2015-05-06 NOTE — Telephone Encounter (Signed)
Pt will need a refill for clonazepam (KLONOPIN) 0.5 MG tablet. Please call when prescription is ready for pickup. Pt has a f/u appt on 05/24/15.

## 2015-05-06 NOTE — Telephone Encounter (Signed)
Printed klonopine for pick up

## 2015-05-13 ENCOUNTER — Ambulatory Visit (INDEPENDENT_AMBULATORY_CARE_PROVIDER_SITE_OTHER): Payer: 59 | Admitting: Licensed Clinical Social Worker

## 2015-05-13 DIAGNOSIS — F4310 Post-traumatic stress disorder, unspecified: Secondary | ICD-10-CM

## 2015-05-13 DIAGNOSIS — F411 Generalized anxiety disorder: Secondary | ICD-10-CM | POA: Diagnosis not present
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
## 2015-05-23 ENCOUNTER — Other Ambulatory Visit (HOSPITAL_COMMUNITY): Payer: Self-pay | Admitting: Psychiatry

## 2015-05-23 ENCOUNTER — Ambulatory Visit (HOSPITAL_COMMUNITY): Payer: Self-pay | Admitting: Psychiatry

## 2015-05-24 ENCOUNTER — Encounter (HOSPITAL_COMMUNITY): Payer: Self-pay | Admitting: Psychiatry

## 2015-05-24 ENCOUNTER — Ambulatory Visit (INDEPENDENT_AMBULATORY_CARE_PROVIDER_SITE_OTHER): Payer: 59 | Admitting: Psychiatry

## 2015-05-24 VITALS — BP 124/66 | HR 91 | Ht 67.0 in | Wt 209.0 lb

## 2015-05-24 DIAGNOSIS — F411 Generalized anxiety disorder: Secondary | ICD-10-CM

## 2015-05-24 DIAGNOSIS — F410 Panic disorder [episodic paroxysmal anxiety] without agoraphobia: Secondary | ICD-10-CM

## 2015-05-24 DIAGNOSIS — F4310 Post-traumatic stress disorder, unspecified: Secondary | ICD-10-CM | POA: Diagnosis not present

## 2015-05-24 DIAGNOSIS — F331 Major depressive disorder, recurrent, moderate: Secondary | ICD-10-CM | POA: Diagnosis not present

## 2015-05-24 MED ORDER — BUSPIRONE HCL 10 MG PO TABS: ORAL_TABLET | ORAL | Status: DC
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
## 2015-05-30 NOTE — Telephone Encounter (Signed)
Received medication request from [facility] for Paxil 40mg . Per Dr. Gilmore LarocheAkhtar, pt is authorized for a Paxil 40mg , #45. Prescription was sent to pharmacy. Pt is schedule for a f/u appt on 06/25/15. Called and informed pt of prescription status. Pt verbalizes understanding.

## 2015-06-20 ENCOUNTER — Ambulatory Visit (HOSPITAL_COMMUNITY): Payer: Self-pay | Admitting: Psychiatry

## 2015-07-05 ENCOUNTER — Ambulatory Visit (INDEPENDENT_AMBULATORY_CARE_PROVIDER_SITE_OTHER): Payer: 59 | Admitting: Psychiatry

## 2015-07-05 ENCOUNTER — Encounter (HOSPITAL_COMMUNITY): Payer: Self-pay | Admitting: Psychiatry

## 2015-07-05 VITALS — BP 128/70 | HR 82 | Ht 67.0 in | Wt 215.0 lb

## 2015-07-05 DIAGNOSIS — F4310 Post-traumatic stress disorder, unspecified: Secondary | ICD-10-CM | POA: Diagnosis not present

## 2015-07-05 DIAGNOSIS — F410 Panic disorder [episodic paroxysmal anxiety] without agoraphobia: Secondary | ICD-10-CM | POA: Diagnosis not present

## 2015-07-05 DIAGNOSIS — F411 Generalized anxiety disorder: Secondary | ICD-10-CM | POA: Diagnosis not present

## 2015-07-05 DIAGNOSIS — F331 Major depressive disorder, recurrent, moderate: Secondary | ICD-10-CM | POA: Diagnosis not present

## 2015-07-05 MED ORDER — PAROXETINE HCL 40 MG PO TABS: ORAL_TABLET | ORAL | Status: DC

## 2015-07-05 MED ORDER — CLONAZEPAM 0.5 MG PO TABS: 0.5000 mg | ORAL_TABLET | Freq: Every day | ORAL | Status: DC | PRN

## 2015-07-05 MED ORDER — BUSPIRONE HCL 10 MG PO TABS: ORAL_TABLET | ORAL | Status: DC
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
## 2015-08-26 ENCOUNTER — Other Ambulatory Visit (HOSPITAL_COMMUNITY): Payer: Self-pay | Admitting: Psychiatry

## 2015-08-26 NOTE — Telephone Encounter (Signed)
Pt will need to a prescription written for Klonopin.  Pt is schedule for a f/u appt on 09/23/15.

## 2015-08-29 ENCOUNTER — Telehealth (HOSPITAL_COMMUNITY): Payer: Self-pay | Admitting: *Deleted

## 2015-08-29 NOTE — Telephone Encounter (Signed)
Pt called for a refill for Klonopin 0.5mg . Pt will pick up rx on 08/30/15.

## 2015-08-30 MED ORDER — CLONAZEPAM 0.5 MG PO TABS: 0.5000 mg | ORAL_TABLET | Freq: Every day | ORAL | Status: DC | PRN

## 2015-08-30 NOTE — Telephone Encounter (Signed)
klonopine printed for pickup 

## 2015-09-23 ENCOUNTER — Ambulatory Visit (HOSPITAL_COMMUNITY): Payer: Self-pay | Admitting: Psychiatry

## 2015-10-04 ENCOUNTER — Ambulatory Visit (INDEPENDENT_AMBULATORY_CARE_PROVIDER_SITE_OTHER): Payer: 59 | Admitting: Psychiatry

## 2015-10-04 ENCOUNTER — Encounter (HOSPITAL_COMMUNITY): Payer: Self-pay | Admitting: Psychiatry

## 2015-10-04 VITALS — BP 128/70 | HR 97 | Ht 67.0 in | Wt 219.0 lb

## 2015-10-04 DIAGNOSIS — F410 Panic disorder [episodic paroxysmal anxiety] without agoraphobia: Secondary | ICD-10-CM | POA: Diagnosis not present

## 2015-10-04 DIAGNOSIS — F331 Major depressive disorder, recurrent, moderate: Secondary | ICD-10-CM | POA: Diagnosis not present

## 2015-10-04 DIAGNOSIS — F4310 Post-traumatic stress disorder, unspecified: Secondary | ICD-10-CM

## 2015-10-04 DIAGNOSIS — F411 Generalized anxiety disorder: Secondary | ICD-10-CM | POA: Diagnosis not present

## 2015-10-04 MED ORDER — PAROXETINE HCL 40 MG PO TABS: ORAL_TABLET | ORAL | Status: DC

## 2015-10-04 MED ORDER — CLONAZEPAM 0.5 MG PO TABS: 0.5000 mg | ORAL_TABLET | Freq: Every day | ORAL | Status: DC | PRN

## 2015-10-04 MED ORDER — BUSPIRONE HCL 10 MG PO TABS: ORAL_TABLET | ORAL | Status: DC
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
## 2015-10-11 ENCOUNTER — Ambulatory Visit (HOSPITAL_COMMUNITY): Payer: Self-pay | Admitting: Licensed Clinical Social Worker

## 2015-10-14 ENCOUNTER — Ambulatory Visit (HOSPITAL_COMMUNITY): Payer: Self-pay | Admitting: Licensed Clinical Social Worker

## 2015-11-22 ENCOUNTER — Other Ambulatory Visit (HOSPITAL_COMMUNITY): Payer: Self-pay | Admitting: Psychiatry

## 2015-11-28 NOTE — Telephone Encounter (Signed)
[facility]. Per Dr. Gilmore LarocheAkhtar, refill request is denied. lvm for pt to contact office. Printed Rx for klonopin 0.5mg , #30 is ready for pt.  Pt f/u appt is on 12/26/15.

## 2015-12-26 ENCOUNTER — Ambulatory Visit (HOSPITAL_COMMUNITY): Payer: Self-pay | Admitting: Psychiatry

## 2015-12-28 ENCOUNTER — Encounter (HOSPITAL_COMMUNITY): Payer: Self-pay | Admitting: Psychiatry

## 2016-01-12 ENCOUNTER — Ambulatory Visit (HOSPITAL_COMMUNITY): Payer: Self-pay | Admitting: Psychiatry

## 2016-04-26 ENCOUNTER — Other Ambulatory Visit (HOSPITAL_COMMUNITY): Payer: Self-pay | Admitting: Psychiatry

## 2016-04-29 NOTE — Telephone Encounter (Signed)
Medication refill-  Received fax from [facility] requesting a refill for Paxil. Per Dr. Gilmore LarocheAkhtar, refill request is denied. Pt was d/c from clinic on 12/28/15. No refills will be issued for pt. Pharmacy notified. Nothing further is need at this time.

## 2016-08-05 ENCOUNTER — Emergency Department (INDEPENDENT_AMBULATORY_CARE_PROVIDER_SITE_OTHER): Payer: 59

## 2016-08-05 ENCOUNTER — Encounter: Payer: Self-pay | Admitting: Emergency Medicine

## 2016-08-05 ENCOUNTER — Emergency Department (INDEPENDENT_AMBULATORY_CARE_PROVIDER_SITE_OTHER)
Admission: EM | Admit: 2016-08-05 | Discharge: 2016-08-05 | Disposition: A | Discharge status: Home / Self Care | Payer: 59 | Source: Home / Self Care | Attending: Family Medicine | Admitting: Family Medicine

## 2016-08-05 DIAGNOSIS — B9789 Other viral agents as the cause of diseases classified elsewhere: Secondary | ICD-10-CM | POA: Diagnosis not present

## 2016-08-05 DIAGNOSIS — J069 Acute upper respiratory infection, unspecified: Secondary | ICD-10-CM | POA: Diagnosis not present

## 2016-08-05 DIAGNOSIS — R05 Cough: Secondary | ICD-10-CM | POA: Diagnosis not present

## 2016-08-05 MED ORDER — BENZONATATE 100 MG PO CAPS: 100.0000 mg | ORAL_CAPSULE | Freq: Three times a day (TID) | ORAL | 0 refills | Status: DC

## 2016-08-05 MED ORDER — GUAIFENESIN-CODEINE 100-10 MG/5ML PO SYRP: 5.0000 mL | ORAL_SOLUTION | Freq: Three times a day (TID) | ORAL | 0 refills | Status: DC | PRN

## 2016-08-05 NOTE — Discharge Instructions (Signed)
°  Virtussin (guaifenesin-codeine) is a strong narcotic cough medication.  Only take up to 3 times daily as needed for severe cough.  Be sure to take with large glass of water.  It may cause drowsiness. Do not drive, drink alcohol or take other sedating medications such as Nyquil while taking this medication.  ° °

## 2016-08-05 NOTE — ED Provider Notes (Signed)
CSN: [id number]     Arrival date & time 08/05/16  1347 History   First MD Initiated Contact with Patient 08/05/16 1401     Chief Complaint  Patient presents with  . URI   (Consider location/radiation/quality/duration/timing/severity/associated sxs/prior Treatment) HPI  Jamison NeighborChristine A Burrough is a 41 y.o. female presenting to UC with c/o 1 week of coughing that is minimally productive, associated fatigue, chest soreness from cough and worse when lying down. Her husband was sick recently with similar symptoms but was better in about 4 days.  Denies fever, chills, n/v/d. No hx of asthma. She has had pleurisy before and does not want cough to turn into that again.   Past Medical History:  Diagnosis Date  . Anxiety   . Depression   . Headache(784.0)   . Thyroid disease    History reviewed. No pertinent surgical history. Family History  Problem Relation Age of Onset  . Depression Father   . Alcohol abuse Father   . Anxiety disorder Paternal Aunt   . Depression Paternal Aunt    Social History  Substance Use Topics  . Smoking status: Never Smoker  . Smokeless tobacco: Never Used     Comment: NA  . Alcohol use No   OB History    No data available     Review of Systems  Constitutional: Positive for fatigue. Negative for chills and fever.  HENT: Positive for congestion, ear pain (fullness) and postnasal drip. Negative for sore throat, trouble swallowing and voice change.   Respiratory: Positive for cough. Negative for shortness of breath.   Cardiovascular: Negative for chest pain and palpitations.  Gastrointestinal: Negative for abdominal pain, diarrhea, nausea and vomiting.  Musculoskeletal: Negative for arthralgias, back pain and myalgias.  Skin: Negative for rash.    Allergies  Patient has no known allergies.  Home Medications   Prior to Admission medications   Medication Sig Start Date End Date Taking? Authorizing Provider  Cholecalciferol (VITAMIN D PO) Take by mouth.    Yes [provider]  Sertraline HCl (ZOLOFT PO) Take by mouth.   Yes [provider]  benzonatate (TESSALON) 100 MG capsule Take 1-2 capsules (100-200 mg total) by mouth every 8 (eight) hours. 08/05/16   Junius Finner'Malley, Adeleine Pask, PA-C  guaiFENesin-codeine [facility](VIRTUSSIN A/C) 100-10 MG/5ML syrup Take 5-10 mLs by mouth 3 (three) times daily as needed for cough or congestion. 08/05/16   Junius Finner'Malley, Bosten Newstrom, PA-C  levothyroxine (SYNTHROID, LEVOTHROID) 100 MCG tablet Take 175 mcg by mouth daily before breakfast.  09/30/13   [provider]   Meds Ordered and Administered this Visit  Medications - No data to display  BP (!) 134/94 (BP Location: Left Arm)   Pulse 99   Temp 97.7 F (36.5 C) (Oral)   Ht 5\' 7"  (1.702 m)   Wt 206 lb 12 oz (93.8 kg)   LMP 07/19/2016   SpO2 95%   BMI 32.38 kg/m  No data found.   Physical Exam  Constitutional: She is oriented to person, place, and time. She appears well-developed and well-nourished. No distress.  HENT:  Head: Normocephalic and atraumatic.  Right Ear: Tympanic membrane normal.  Left Ear: Tympanic membrane normal.  Nose: Nose normal.  Mouth/Throat: Uvula is midline, oropharynx is clear and moist and mucous membranes are normal.  Eyes: EOM are normal.  Neck: Normal range of motion. Neck supple.  Cardiovascular: Normal rate and regular rhythm.   Pulmonary/Chest: Effort normal and breath sounds normal. No stridor. No respiratory distress. She has no  wheezes. She has no rales.  Intermittent dry to wet cough during exam. Lungs: CTAB  Musculoskeletal: Normal range of motion.  Lymphadenopathy:    She has no cervical adenopathy.  Neurological: She is alert and oriented to person, place, and time.  Skin: Skin is warm and dry. She is not diaphoretic.  Psychiatric: She has a normal mood and affect. Her behavior is normal.  Nursing note and vitals reviewed.   Urgent Care Course     Procedures (including critical care time)  Labs  Review Labs Reviewed - No data to display  Imaging Review Dg Chest 2 View  Result Date: 08/05/2016 CLINICAL DATA:  Cough, congestion EXAM: CHEST  2 VIEW COMPARISON:  None. FINDINGS: Heart and mediastinal contours are within normal limits. No focal opacities or effusions. No acute bony abnormality. IMPRESSION: No active cardiopulmonary disease. Electronically Signed   By: Charlett Nose M.D.   On: 08/05/2016 14:18     MDM   1. Viral URI with cough    Hx and exam c/w viral illness.   Rx: Tessalon and Virtussin for coug F/u with PCP in 7-10 days if not improving, sooner if worsening.    Junius Finner, PA-C 08/05/16 1432

## 2016-08-05 NOTE — ED Triage Notes (Signed)
Pt c/o head and chest congestion x 1 week, getting worse, feeling fatigue, feels like a brick sitting on her chest, productive cough worse when laying down.

## 2016-08-29 IMAGING — CR DG WRIST COMPLETE 3+V*L*
4 series · 4 of 4 positions shown · non-contrast
Comparison: None.

CLINICAL DATA: Fall down stairs. Left wrist injury and pain.
Initial encounter.

EXAM:
LEFT WRIST - COMPLETE 3+ VIEW

[wrist pa]
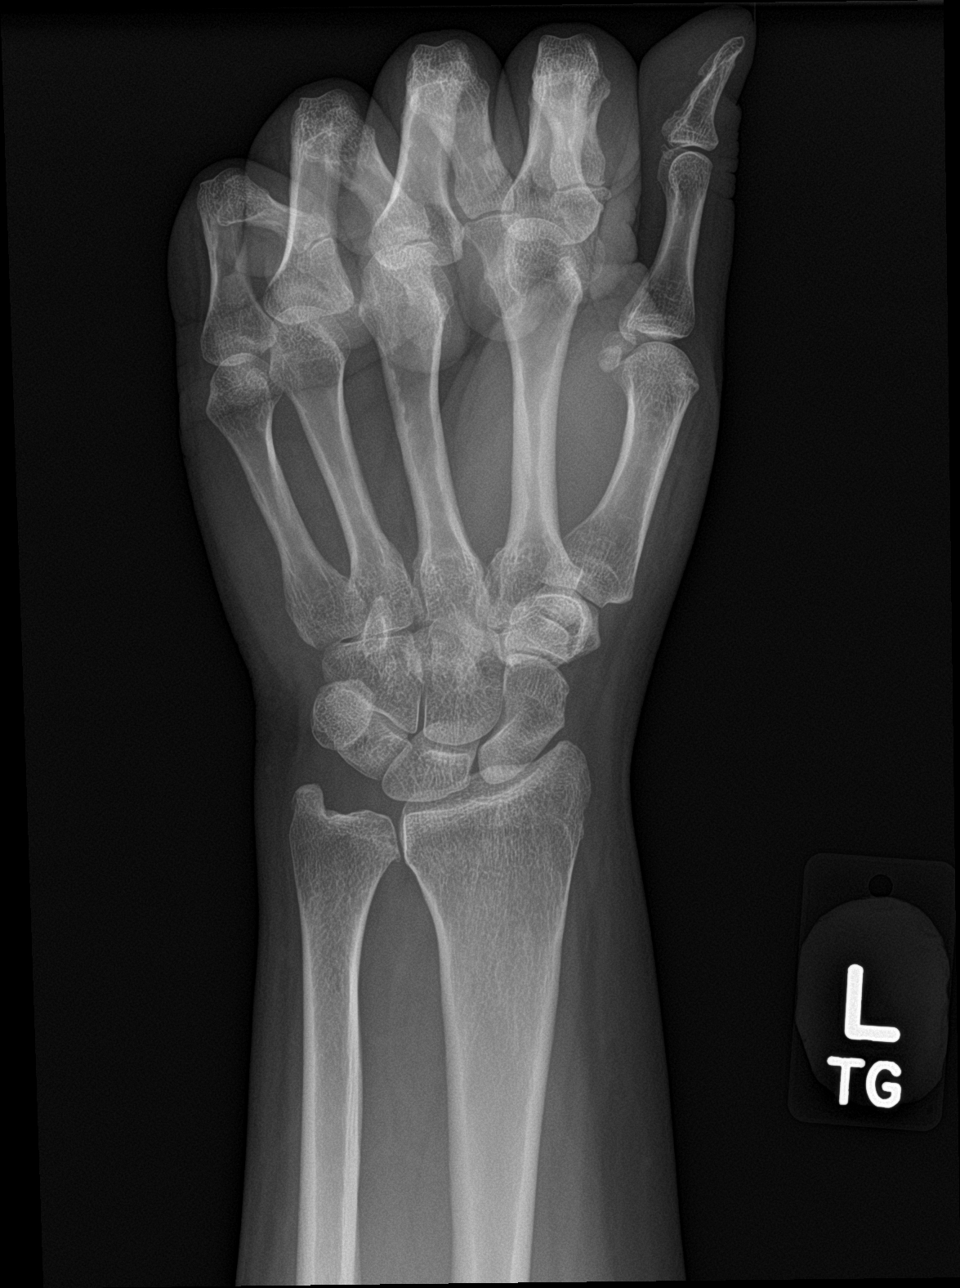

[wrist obl]
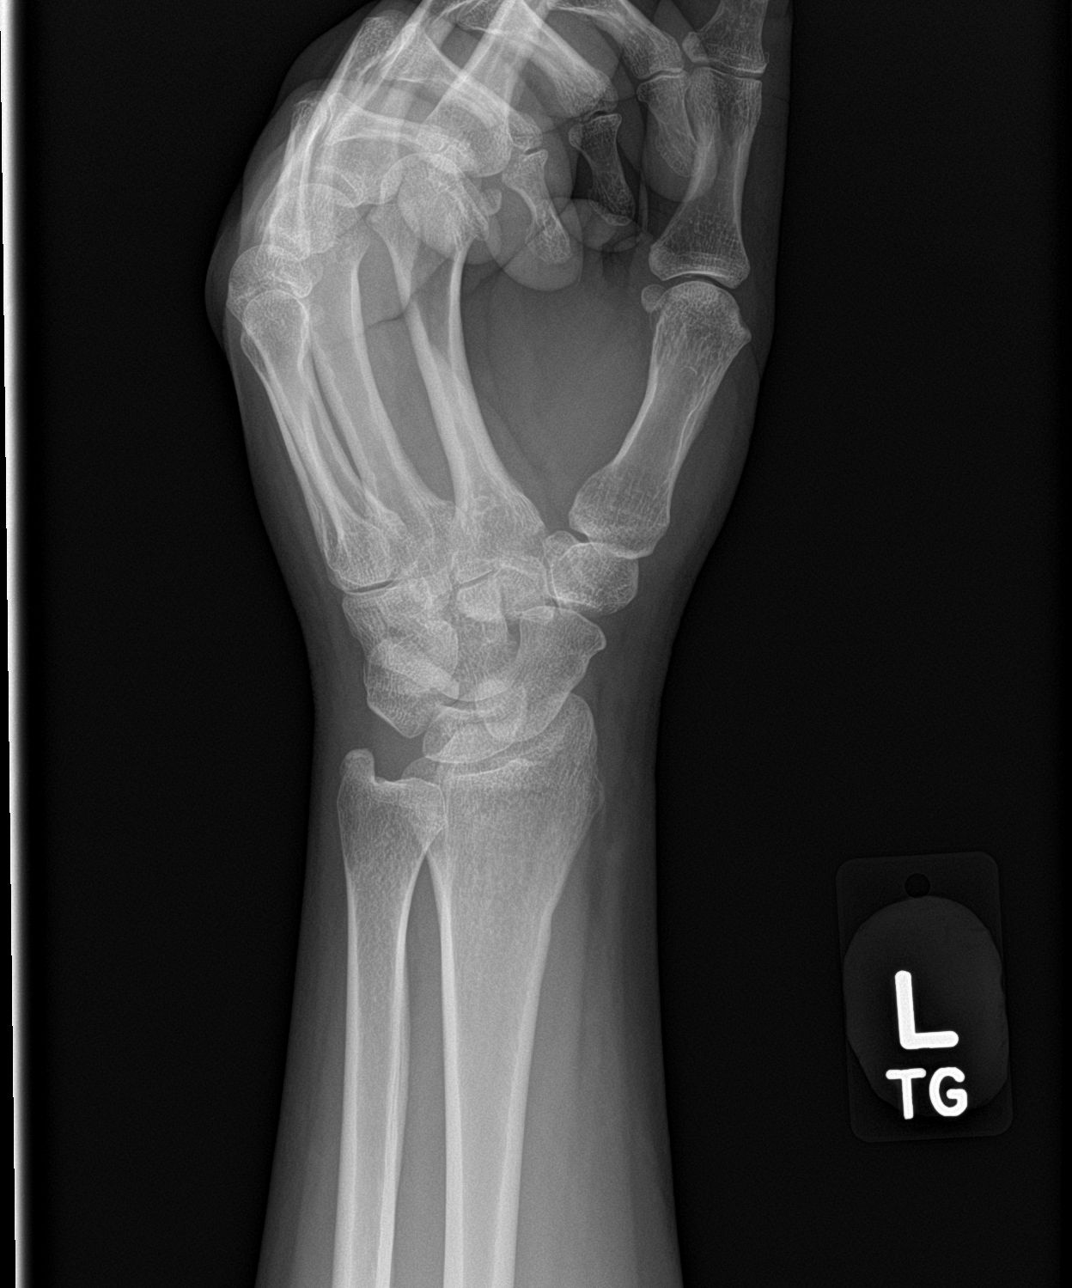

[wrist lat]
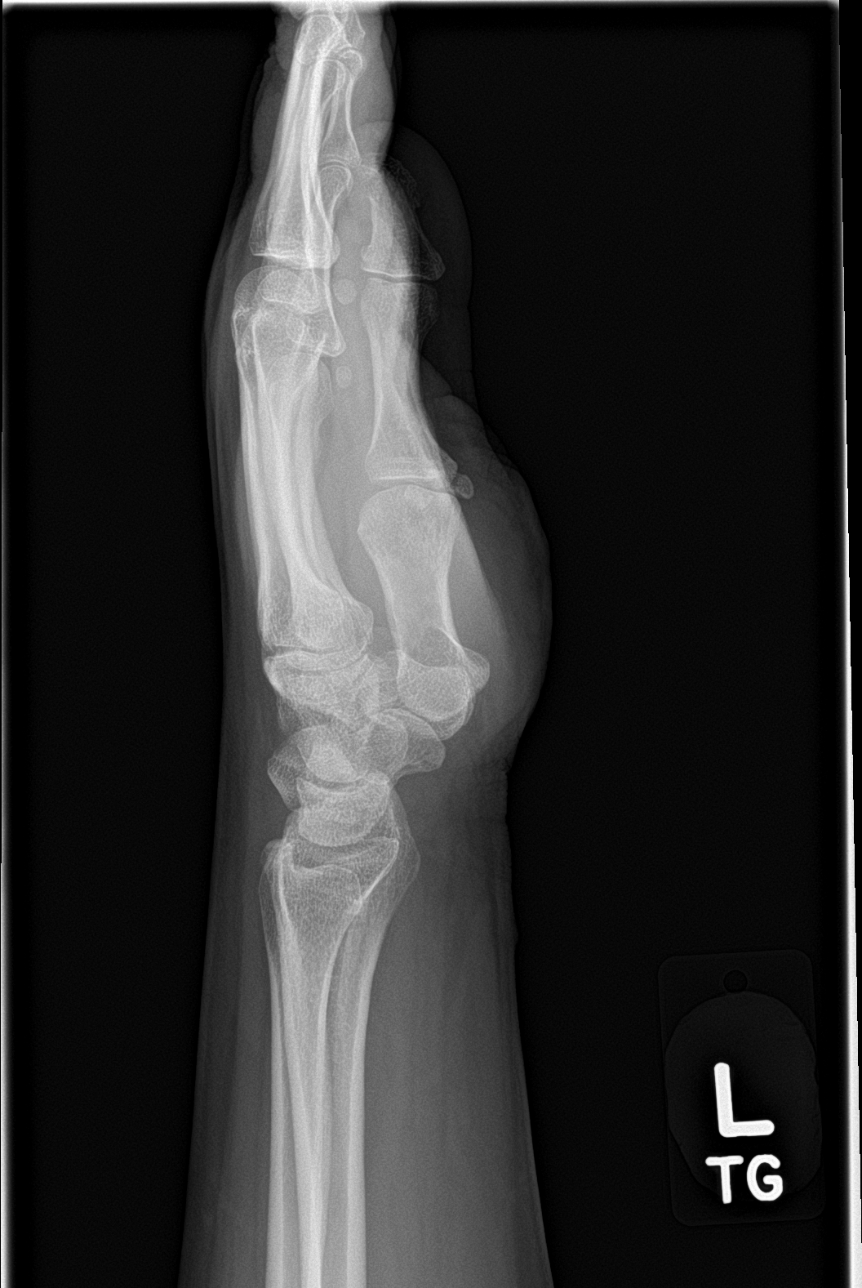

[wrist navicular]
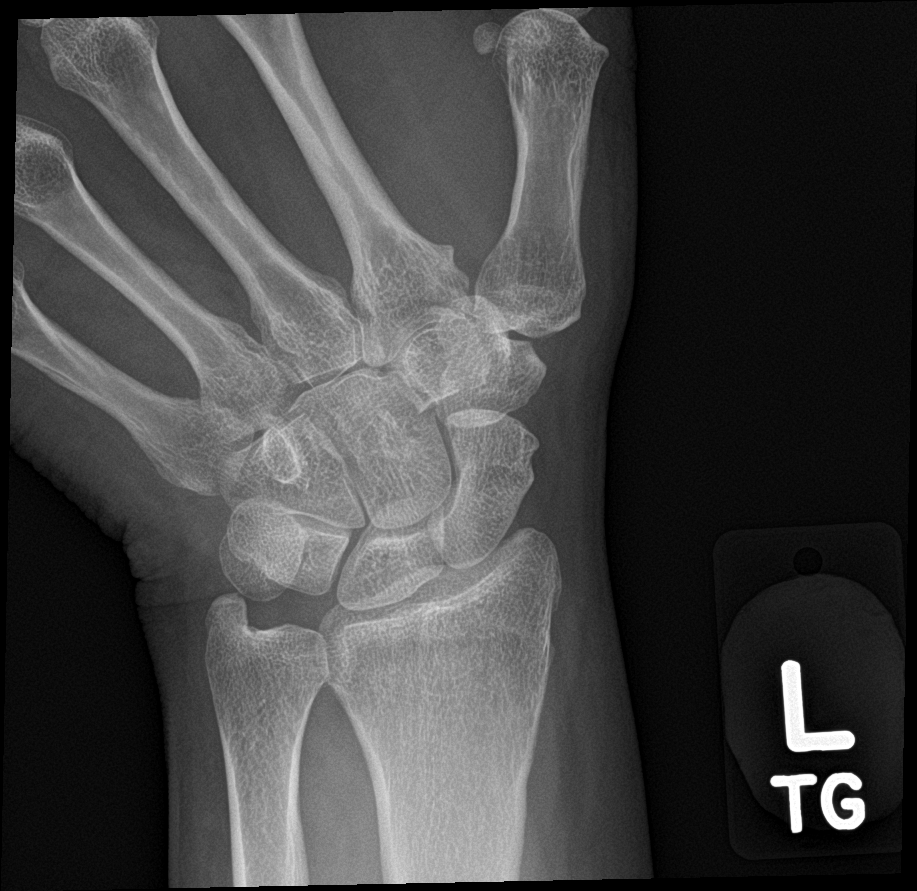

[4 of 4 positions shown; findings below may reference images not displayed]

FINDINGS: There is no evidence of fracture or dislocation. There is no
evidence of arthropathy or other focal bone abnormality. Soft
tissues are unremarkable.
IMPRESSION: Negative.

## 2021-09-10 ENCOUNTER — Encounter: Payer: Self-pay | Admitting: Emergency Medicine

## 2021-09-10 ENCOUNTER — Emergency Department (INDEPENDENT_AMBULATORY_CARE_PROVIDER_SITE_OTHER)
Admission: EM | Admit: 2021-09-10 | Discharge: 2021-09-10 | Disposition: A | Discharge status: Home / Self Care | Payer: BLUE CROSS/BLUE SHIELD | Source: Home / Self Care | Attending: Family Medicine | Admitting: Family Medicine

## 2021-09-10 DIAGNOSIS — R519 Headache, unspecified: Secondary | ICD-10-CM | POA: Diagnosis not present

## 2021-09-10 DIAGNOSIS — R0781 Pleurodynia: Secondary | ICD-10-CM | POA: Diagnosis not present

## 2021-09-10 DIAGNOSIS — R4182 Altered mental status, unspecified: Secondary | ICD-10-CM

## 2021-09-10 LAB — POCT URINALYSIS DIP (MANUAL ENTRY)
Bilirubin, UA: NEGATIVE
Glucose, UA: NEGATIVE mg/dL
Ketones, POC UA: NEGATIVE mg/dL
Nitrite, UA: NEGATIVE
Protein Ur, POC: 30 mg/dL — AB
Spec Grav, UA: >=1.03 — AB (ref 1.010–1.025)
Urobilinogen, UA: 0.2 E.U./dL
pH, UA: 6 (ref 5.0–8.0)

## 2021-09-10 LAB — POCT FASTING CBG KUC MANUAL ENTRY: POCT Glucose (KUC): 108 mg/dL — AB (ref 70–99)

## 2021-09-10 MED ORDER — ACETAMINOPHEN 325 MG PO TABS
650.0000 mg | ORAL_TABLET | Freq: Once | ORAL | Status: AC
Start: 2021-09-10 — End: 2021-09-10
  Administered 2021-09-10: 650 mg via ORAL

## 2021-09-10 NOTE — ED Triage Notes (Addendum)
Headache w/ fatigue x 1 week  Pt was at a softball games last Saturday  No history of migraines Pt has concerns for sun stroke  Brain fog since Monday  Tylenol -min relief  Pt states back pain feels like pleurisy ( history  of it) Hurts to take a deep breath Here w/ husband  2 negative home COVID tests

## 2021-09-10 NOTE — ED Provider Notes (Signed)
[name]    CSN: [id number] Arrival date & time: 09/10/21  1007      History   Chief Complaint Chief Complaint  Patient presents with   Headache    HPI Teresa Estrada is a 46 y.o. female.   HPI  Patient is here with a number of symptoms.  She states that this started after she sat for eye double header softball came out in the sun last Saturday.  This is 8 days ago.  She states that she has had a headache that feels like a pressure across her forehead.  She also has felt more body aches and tired.  She has pleuritic chest, pain with deep breath that is in her back area.  She has mental status changes, states she has "brain fog".  Her memory is impaired.  She asked the same question over and over again.  Her speech and her thinking feels slow to her.  She is here with her husband who agrees. No trauma or head injury No fever or chills, runny nose cough or sore throat Husband states her appetite is down but she has been drinking a lot. Denies problems with bowels or digestion.  Denies urinary symptoms.  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 No pertinent surgical history.  OB History   No obstetric history on file.      Home Medications    Prior to Admission medications   Medication Sig Start Date End Date Taking? Authorizing Provider  atorvastatin (LIPITOR) 10 MG tablet Take 10 mg by mouth daily. 09/10/21   [provider]  Cholecalciferol (VITAMIN D PO) Take by mouth.    [provider]  clonazePAM (KLONOPIN) 0.5 MG tablet Take 0.5 mg by mouth 2 (two) times  daily as needed. 05/30/21   [provider]  gabapentin (NEURONTIN) 300 MG capsule Take by mouth. 09/10/21   [provider]  hydrOXYzine (VISTARIL) 25 MG capsule Take 25 mg by mouth 3 (three) times daily as needed. 08/27/21   [provider]  levothyroxine (SYNTHROID) 175 MCG tablet Take 175 mcg by mouth daily. 08/27/21   [provider]  sertraline (ZOLOFT) 100 MG tablet Take 150 mg by mouth daily. 08/04/21   [provider]  sertraline (ZOLOFT) 25 MG tablet Take by mouth. 08/30/21   [provider]  traZODone (DESYREL) 50 MG tablet Take 50-100 mg by mouth at bedtime. 08/04/21   [provider]  buPROPion (WELLBUTRIN) 75 MG tablet Take by mouth.  03/24/15  [provider]    Family History Family History  Problem Relation Age of Onset   Depression Father    Alcohol abuse Father    Anxiety disorder Paternal Aunt    Depression Paternal Aunt     Social History Social History   Tobacco Use   Smoking status: Never   Smokeless tobacco: Never   Tobacco comments:    NA  Vaping Use   Vaping Use: Never used  Substance Use Topics   Alcohol use: No   Drug use: No     Allergies  Patient has no known allergies.   Review of Systems Review of Systems See HPI  Physical Exam Triage Vital Signs ED Triage Vitals  Enc Vitals Group     BP 09/10/21 1112 (!) 121/91     Pulse Rate 09/10/21 1112 86     Resp 09/10/21 1112 17     Temp 09/10/21 1112 98.9 F (37.2 C)     Temp Source 09/10/21 1112 Oral     SpO2 09/10/21 1112 99 %     Weight 09/10/21 1117 190 lb (86.2 kg)     Height 09/10/21 1117 5\' 7"  (1.702 m)     Head Circumference --      Peak Flow --      Pain Score 09/10/21 1115 7     Pain Loc --      Pain Edu? --      Excl. in [place]? --    No data found.  Updated Vital Signs BP (!) 121/91 (BP Location: Left Arm)   Pulse 86   Temp 98.9 F (37.2 C) (Oral)   Resp 17   Ht 5\' 7"  (1.702 m)   Wt 86.2 kg   LMP  09/08/2021 (Exact Date)   SpO2 99%   BMI 29.76 kg/m       Physical Exam Constitutional:      General: She is not in acute distress.    Appearance: She is well-developed. She is ill-appearing.  HENT:     Head: Normocephalic and atraumatic.      Right Ear: Tympanic membrane and ear canal normal.     Left Ear: Tympanic membrane and ear canal normal.     Nose: No congestion.     Mouth/Throat:     Mouth: Mucous membranes are moist.  Eyes:     Conjunctiva/sclera: Conjunctivae normal.     Pupils: Pupils are equal, round, and reactive to light.     Comments: Discs are flat  Cardiovascular:     Rate and Rhythm: Normal rate and regular rhythm.     Heart sounds: Normal heart sounds.  Pulmonary:     Effort: Pulmonary effort is normal. No respiratory distress.     Breath sounds: Normal breath sounds.  Chest:     Chest wall: No tenderness.  Abdominal:     General: Abdomen is flat. There is no distension.     Palpations: Abdomen is soft.     Tenderness: There is no abdominal tenderness. There is no right CVA tenderness or left CVA tenderness.  Musculoskeletal:        General: Normal range of motion.     Cervical back: Normal range of motion.     Right lower leg: No edema.     Left lower leg: No edema.  Lymphadenopathy:     Cervical: No cervical adenopathy.  Skin:    General: Skin is warm and dry.     Findings: Rash present.  Neurological:     General: No focal deficit present.     Mental Status: She is alert.  Psychiatric:        Mood and Affect: Mood normal.        Behavior: Behavior normal.     Comments: Patient sometimes looks to her husband for answers.  Sometimes hesitates in response to questions.  No slurred speech.  No facial asymmetry      UC Treatments / Results  Labs (all labs ordered are listed, but only abnormal results are displayed) Labs Reviewed  POCT URINALYSIS DIP (  MANUAL ENTRY) - Abnormal; Notable for the following components:      Result Value    Clarity, UA cloudy (*)    Spec Grav, UA >=1.030 (*)    Blood, UA large (*)    Protein Ur, POC =30 (*)    Leukocytes, UA Small (1+) (*)    All other components within normal limits  POCT FASTING CBG [facility] MANUAL ENTRY - Abnormal; Notable for the following components:   POCT Glucose ([facility]) 108 (*)    All other components within normal limits  CBC WITH DIFFERENTIAL/PLATELET  COMPLETE METABOLIC PANEL WITH GFR  SEDIMENTATION RATE    EKG   Radiology No results found.  Procedures Procedures (including critical care time)  Medications Ordered in UC Medications  acetaminophen (TYLENOL) tablet 650 mg (650 mg Oral Given 09/10/21 1135)    Initial Impression / Assessment and Plan / UC Course  I have reviewed the triage vital signs and the nursing notes.  Pertinent labs & imaging results that were available during my care of the patient were reviewed by me and considered in my medical decision making (see chart for details).     Patient does admit to having increased stress.  I told her her multiple symptoms are nonspecific, however, headache and mental status changes is potentially worrisome.  This has been stable over the last 8 days.  I recommend that they have additional evaluation.  If not the emergency room now, evaluation no later than tomorrow by her primary care doctor.  She is on medications that can cause some sedation, although none of her medicines changed recently. Patient has a malar rash.  She states she has rosacea but this is more pronounced Final Clinical Impressions(s) / UC Diagnoses   Final diagnoses:  Altered mental status, unspecified altered mental status type  Intractable headache, unspecified chronicity pattern, unspecified headache type  Pleuritic chest pain     Discharge Instructions      I recommend  calling your doctor tomorrow Your labs will be available on my chart Keep good fluid intake GO TO ER if you become worse.   ED Prescriptions   None     PDMP not reviewed this encounter.   Eustace Moore, MD 09/10/21 [phone]

## 2021-09-12 LAB — CBC WITH DIFFERENTIAL/PLATELET
Absolute Monocytes: 432 cells/uL (ref 200–950)
Basophils Absolute: 38 cells/uL (ref 0–200)
Basophils Relative: 0.7 %
Eosinophils Absolute: 70 cells/uL (ref 15–500)
Eosinophils Relative: 1.3 %
HCT: 37.7 % (ref 35.0–45.0)
Hemoglobin: 12.3 g/dL (ref 11.7–15.5)
Lymphs Abs: 2020 cells/uL (ref 850–3900)
MCH: 25.2 pg — ABNORMAL LOW (ref 27.0–33.0)
MCHC: 32.6 g/dL (ref 32.0–36.0)
MCV: 77.3 fL — ABNORMAL LOW (ref 80.0–100.0)
MPV: 9 fL (ref 7.5–12.5)
Monocytes Relative: 8 %
Neutro Abs: 2840 cells/uL (ref 1500–7800)
Neutrophils Relative %: 52.6 %
Platelets: 383 10*3/uL (ref 140–400)
RBC: 4.88 10*6/uL (ref 3.80–5.10)
RDW: 14.9 % (ref 11.0–15.0)
Total Lymphocyte: 37.4 %
WBC: 5.4 10*3/uL (ref 3.8–10.8)

## 2021-09-12 LAB — COMPLETE METABOLIC PANEL WITH GFR
AG Ratio: 1.6 (calc) (ref 1.0–2.5)
ALT: 9 U/L (ref 6–29)
AST: 13 U/L (ref 10–35)
Albumin: 4.5 g/dL (ref 3.6–5.1)
Alkaline phosphatase (APISO): 28 U/L — ABNORMAL LOW (ref 31–125)
BUN: 14 mg/dL (ref 7–25)
CO2: 22 mmol/L (ref 20–32)
Calcium: 9.3 mg/dL (ref 8.6–10.2)
Chloride: 104 mmol/L (ref 98–110)
Creat: 0.85 mg/dL (ref 0.50–0.99)
Globulin: 2.8 g/dL (calc) (ref 1.9–3.7)
Glucose, Bld: 103 mg/dL — ABNORMAL HIGH (ref 65–99)
Potassium: 4.4 mmol/L (ref 3.5–5.3)
Sodium: 136 mmol/L (ref 135–146)
Total Bilirubin: 0.4 mg/dL (ref 0.2–1.2)
Total Protein: 7.3 g/dL (ref 6.1–8.1)
eGFR: 85 mL/min/{1.73_m2} (ref >=60)

## 2021-09-12 LAB — SEDIMENTATION RATE: Sed Rate: 25 mm/h — ABNORMAL HIGH (ref 0–20)

## 2022-04-15 ENCOUNTER — Encounter: Payer: Self-pay | Admitting: Emergency Medicine

## 2022-04-15 ENCOUNTER — Ambulatory Visit (INDEPENDENT_AMBULATORY_CARE_PROVIDER_SITE_OTHER): Payer: BC Managed Care – PPO

## 2022-04-15 ENCOUNTER — Other Ambulatory Visit: Payer: Self-pay

## 2022-04-15 ENCOUNTER — Ambulatory Visit
Admission: EM | Admit: 2022-04-15 | Discharge: 2022-04-15 | Disposition: A | Discharge status: Home / Self Care | Payer: BC Managed Care – PPO | Attending: Family Medicine | Admitting: Family Medicine

## 2022-04-15 DIAGNOSIS — R0602 Shortness of breath: Secondary | ICD-10-CM

## 2022-04-15 DIAGNOSIS — R079 Chest pain, unspecified: Secondary | ICD-10-CM

## 2022-04-15 DIAGNOSIS — R0789 Other chest pain: Secondary | ICD-10-CM

## 2022-04-15 MED ORDER — TRAMADOL-ACETAMINOPHEN 37.5-325 MG PO TABS: 2.0000 | ORAL_TABLET | Freq: Four times a day (QID) | ORAL | 0 refills | Status: AC | PRN

## 2022-04-15 MED ORDER — TRAMADOL-ACETAMINOPHEN 37.5-325 MG PO TABS: 2.0000 | ORAL_TABLET | Freq: Four times a day (QID) | ORAL | 0 refills | Status: DC | PRN

## 2022-04-15 MED ORDER — KETOROLAC TROMETHAMINE 30 MG/ML IJ SOLN
30.0000 mg | Freq: Once | INTRAMUSCULAR | Status: AC
  Administered 2022-04-15: 30 mg via INTRAMUSCULAR

## 2022-04-15 MED ORDER — AZITHROMYCIN 250 MG PO TABS: ORAL_TABLET | ORAL | 0 refills | Status: DC

## 2022-04-15 NOTE — ED Provider Notes (Signed)
[facility]    CSN: [id number] Arrival date & time: 04/15/22  1601      History   Chief Complaint Chief Complaint  Patient presents with   Shortness of Breath   Back Pain    HPI Teresa Estrada is a 47 y.o. female.   HPI  Patient started with some cold symptoms on Thursday.  She had had congestion sore throat runny nose.  She also developed coughing.  Is been sick for 3 days and now has shortness of breath, chest pain.  It hurts with deep breath.  It hurts right in the center of her chest.  It goes through to her back.  She has had a little bit of coughing but states not a lot.  This morning her symptoms have worsened.  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 No pertinent surgical history.  OB History   No obstetric history on file.      Home Medications    Prior to Admission medications   Medication Sig Start Date End Date Taking? Authorizing Provider  atorvastatin (LIPITOR) 10 MG tablet Take 10 mg by mouth daily. 09/10/21  Yes [provider]  azithromycin (ZITHROMAX Z-PAK) 250 MG tablet Take two pills today followed by one a day until gone 04/15/22  Yes Raylene Everts, MD  Cholecalciferol (VITAMIN D PO) Take by mouth.   Yes [provider]  clonazePAM (KLONOPIN) 0.5 MG tablet Take 0.5 mg by mouth 2 (two) times daily as needed. 05/30/21  Yes [provider]  gabapentin (NEURONTIN) 300 MG capsule Take by mouth. 09/10/21  Yes [provider]  hydrOXYzine (VISTARIL) 25 MG capsule Take 25 mg by mouth 3 (three) times daily as needed. 08/27/21  Yes  [provider]  levothyroxine (SYNTHROID) 175 MCG tablet Take 175 mcg by mouth daily. 08/27/21  Yes [provider]  sertraline (ZOLOFT) 100 MG tablet Take 150 mg by mouth daily. 08/04/21  Yes [provider]  sertraline (ZOLOFT) 25 MG tablet Take by mouth. 08/30/21  Yes [provider]  traZODone (DESYREL) 50 MG tablet Take 50-100 mg by mouth at bedtime. 08/04/21  Yes [provider]  traMADol-acetaminophen (ULTRACET) 37.5-325 MG tablet Take 2 tablets by mouth every 6 (six) hours as needed. 04/15/22   Raylene Everts, MD  buPROPion [facility]) 75 MG tablet Take by mouth.  03/24/15  [provider]    Family History Family History  Problem Relation Age of Onset   Depression Father    Alcohol abuse Father    Anxiety disorder Paternal Aunt    Depression Paternal Aunt     Social History Social History   Tobacco Use   Smoking status: Never   Smokeless tobacco: Never   Tobacco comments:    NA  Vaping Use   Vaping Use: Never used  Substance Use Topics   Alcohol use: No   Drug use: No     Allergies   Patient has no known allergies.   Review of Systems Review of Systems See HPI  Physical Exam Triage Vital Signs ED Triage Vitals  Enc Vitals Group     BP 04/15/22 1013 109/75     Pulse Rate 04/15/22 1013 80     Resp 04/15/22 1013 18     Temp 04/15/22 1013 97.9 F (36.6 C)     Temp Source 04/15/22 1013 Oral     SpO2 04/15/22 1013 97 %     Weight --      Height --      Head Circumference --      Peak Flow --      Pain Score 04/15/22 1012 8     Pain Loc --      Pain Edu? --      Excl. in [place]? --    No data found.  Updated Vital Signs BP 109/75 (BP Location: Left Arm)   Pulse 80   Temp 97.9 F (36.6 C) (Oral)   Resp 18   SpO2 97%       Physical Exam Constitutional:      General: She is not in acute distress.    Appearance: She is well-developed. She is ill-appearing.  HENT:     Head: Normocephalic and  atraumatic.     Right Ear: Tympanic membrane and ear canal normal.     Left Ear: Tympanic membrane and ear canal normal.     Nose: Rhinorrhea present.     Mouth/Throat:     Mouth: Mucous membranes are moist.     Pharynx: No posterior oropharyngeal erythema.  Eyes:     Conjunctiva/sclera: Conjunctivae normal.     Pupils: Pupils are equal, round, and reactive to light.  Cardiovascular:     Rate and Rhythm: Normal rate and regular rhythm.     Heart sounds: Normal heart sounds.  Pulmonary:     Effort: Pulmonary effort is normal. No respiratory distress.     Breath sounds: Normal breath sounds.     Comments: Moderate tenderness to palpation of sternum and mid back in general Chest:     Chest wall: Tenderness present.  Abdominal:     General: There is no distension.     Palpations: Abdomen is soft.  Musculoskeletal:        General: Tenderness present. Normal range of motion.     Cervical back: Normal range of motion.  Skin:    General: Skin is warm and dry.  Neurological:     Mental Status: She is alert.      UC Treatments / Results  Labs (all labs ordered are listed, but only abnormal results are displayed) Labs Reviewed - No data to display  EKG   Radiology DG Chest 2 View  Result Date: 04/15/2022 CLINICAL DATA:  Chest pain.  Shortness of breath for 5 days. EXAM: CHEST - 2 VIEW COMPARISON:  Chest x-ray Aug 05, 2016 FINDINGS: Mild subsegmental atelectasis in the bases, right greater than left. The heart, hila, mediastinum, lungs, and pleura are otherwise normal. IMPRESSION: No active cardiopulmonary disease. Electronically Signed   By: Dorise Bullion III M.D.   On: 04/15/2022 11:09    Procedures Procedures (including critical care time)  Medications Ordered in UC Medications  ketorolac (TORADOL) 30 MG/ML injection 30 mg (30 mg Intramuscular Given 04/15/22 1038)    Initial Impression / Assessment and Plan / UC Course  I have reviewed the triage vital signs and the  nursing notes.  Pertinent labs & imaging results that were available during my care of the patient were reviewed by me and considered in my medical decision making (see  chart for details).     Patient states she did see improvement after injection of Toradol  Final Clinical Impressions(s) / UC Diagnoses   Final diagnoses:  Shortness of breath  Chest wall pain     Discharge Instructions      May take ibuprofen 800 mg 3 times a day with food.  This is for moderate pain If pain is severe take Ultracet.  This can cause drowsiness Take the Z-Pak as directed Take lots of fluids Call for problems     ED Prescriptions     Medication Sig Dispense Auth. Provider   azithromycin (ZITHROMAX Z-PAK) 250 MG tablet Take two pills today followed by one a day until gone 6 tablet Eustace Moore, MD   traMADol-acetaminophen (ULTRACET) 37.5-325 MG tablet Take 2 tablets by mouth every 6 (six) hours as needed. 20 tablet Eustace Moore, MD      I have reviewed the PDMP during this encounter.   Eustace Moore, MD 04/15/22 [phone]

## 2022-04-15 NOTE — ED Triage Notes (Signed)
Patient presents to Urgent Care with complaints of shortness of breath since 5 days ago. Patient husband reports her having pain in back, some shortness of breath. This morning symptoms has worsened. Taking a deep breath does hurt. Sitting is uncomfortable.

## 2022-04-15 NOTE — Discharge Instructions (Signed)
May take ibuprofen 800 mg 3 times a day with food.  This is for moderate pain If pain is severe take Ultracet.  This can cause drowsiness Take the Z-Pak as directed Take lots of fluids Call for problems

## 2022-10-28 ENCOUNTER — Ambulatory Visit
Admission: EM | Admit: 2022-10-28 | Discharge: 2022-10-28 | Disposition: A | Discharge status: Home / Self Care | Payer: BC Managed Care – PPO

## 2022-10-28 ENCOUNTER — Other Ambulatory Visit: Payer: Self-pay

## 2022-10-28 ENCOUNTER — Encounter: Payer: Self-pay | Admitting: Emergency Medicine

## 2022-10-28 DIAGNOSIS — R059 Cough, unspecified: Secondary | ICD-10-CM

## 2022-10-28 DIAGNOSIS — U071 COVID-19: Secondary | ICD-10-CM

## 2022-10-28 MED ORDER — BENZONATATE 200 MG PO CAPS: 200.0000 mg | ORAL_CAPSULE | Freq: Three times a day (TID) | ORAL | 0 refills | Status: AC | PRN

## 2022-10-28 MED ORDER — PAXLOVID (300/100) 20 X 150 MG & 10 X 100MG PO TBPK: 3.0000 | ORAL_TABLET | Freq: Two times a day (BID) | ORAL | 0 refills | Status: AC

## 2022-10-28 NOTE — Discharge Instructions (Addendum)
Advised patient to take medication as directed with food to completion.  Advised may take Tessalon capsules daily or as needed for cough.  Encouraged to increase daily water intake to 64 ounces per day while taking these medications.  Advised if symptoms worsen and/or unresolved please follow-up PCP or here for further evaluation.

## 2022-10-28 NOTE — ED Provider Notes (Signed)
[facility]    CSN: [id number] Arrival date & time: 10/28/22  1478      History   Chief Complaint Chief Complaint  Patient presents with   Covid Positive    HPI Teresa Estrada is a 47 y.o. female.   HPI 47 year old female presents with COVID-19 tested positive on Monday, 10/22/2022.  Patient reports she initially tested positive on 7/29 while on vacation with family.  Patient reports that then retested on 10/22/2022 which she is noted still positive.  PMH significant for obesity, anxiety, and thyroid disease.  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 No pertinent surgical history.  OB History   No obstetric history on file.      Home Medications    Prior to Admission medications   Medication Sig Start Date End Date Taking? Authorizing Provider  benzonatate (TESSALON) 200 MG capsule Take 1 capsule (200 mg total) by mouth 3 (three) times daily as needed for up to 7 days. 10/28/22 11/04/22 Yes Trevor Iha, FNP  diazepam (VALIUM) 5 MG tablet Take 5 mg by mouth every 6 (six) hours as needed for sedation or anxiety. 10/22/22  Yes [provider]  nirmatrelvir & ritonavir (PAXLOVID, 300/100,) 20 x 150 MG & 10 x 100MG  TBPK Take 3 tablets by mouth 2 (two) times daily for 5 days. 10/28/22 11/02/22 Yes Trevor Iha, FNP  atorvastatin (LIPITOR) 10 MG tablet Take 10 mg by mouth daily. 09/10/21   [provider]  Cholecalciferol (VITAMIN D PO) Take by mouth.    [provider]  clonazePAM (KLONOPIN) 0.5 MG tablet Take 0.5 mg by mouth 2 (two) times daily as needed. 05/30/21    [provider]  gabapentin (NEURONTIN) 300 MG capsule Take by mouth. 09/10/21   [provider]  hydrOXYzine (VISTARIL) 25 MG capsule Take 25 mg by mouth 3 (three) times daily as needed. 08/27/21   [provider]  levothyroxine (SYNTHROID) 175 MCG tablet Take 175 mcg by mouth daily. 08/27/21   [provider]  sertraline (ZOLOFT) 100 MG tablet Take 150 mg by mouth daily. 08/04/21   [provider]  sertraline (ZOLOFT) 25 MG tablet Take by mouth. 08/30/21   [provider]  traMADol-acetaminophen (ULTRACET) 37.5-325 MG tablet Take 2 tablets by mouth every 6 (six) hours as needed. 04/15/22   Eustace Moore, MD  traZODone (DESYREL) 50 MG tablet Take 50-100 mg by mouth at bedtime. 08/04/21   [provider]  buPROPion (WELLBUTRIN) 75 MG tablet Take by mouth.  03/24/15  [provider]    Family History Family History  Problem Relation Age of Onset   Depression Father    Alcohol abuse Father    Anxiety disorder Paternal Aunt    Depression Paternal Aunt     Social History Social History   Tobacco Use   Smoking status: Never   Smokeless tobacco: Never   Tobacco comments:    NA  Vaping Use   Vaping status: Never Used  Substance Use Topics   Alcohol use: No   Drug use: No     Allergies  Patient has no known allergies.   Review of Systems Review of Systems  Constitutional:  Positive for fatigue.  HENT:  Positive for congestion.   Respiratory:  Positive for cough.   All other systems reviewed and are negative.    Physical Exam Triage Vital Signs ED Triage Vitals  Encounter Vitals Group     BP      Systolic BP Percentile      Diastolic BP Percentile      Pulse      Resp      Temp      Temp src      SpO2      Weight      Height      Head Circumference      Peak Flow      Pain Score      Pain Loc      Pain Education      Exclude from Growth Chart    No data found.  Updated Vital Signs BP  125/82 (BP Location: Left Arm)   Pulse 97   Temp 97.9 F (36.6 C) (Oral)   Resp 18   LMP 10/07/2022 (Approximate)   SpO2 98%      Physical Exam Vitals and nursing note reviewed.  Constitutional:      Appearance: Normal appearance. She is obese. She is ill-appearing.  HENT:     Head: Normocephalic and atraumatic.     Right Ear: Tympanic membrane, ear canal and external ear normal.     Left Ear: Tympanic membrane, ear canal and external ear normal.     Mouth/Throat:     Mouth: Mucous membranes are moist.     Pharynx: Oropharynx is clear.  Eyes:     Extraocular Movements: Extraocular movements intact.     Conjunctiva/sclera: Conjunctivae normal.     Pupils: Pupils are equal, round, and reactive to light.  Cardiovascular:     Rate and Rhythm: Normal rate and regular rhythm.     Pulses: Normal pulses.     Heart sounds: Normal heart sounds.  Pulmonary:     Effort: Pulmonary effort is normal.     Breath sounds: Normal breath sounds. No wheezing, rhonchi or rales.  Musculoskeletal:        General: Normal range of motion.     Cervical back: Normal range of motion and neck supple.  Skin:    General: Skin is warm and dry.  Neurological:     General: No focal deficit present.     Mental Status: She is alert and oriented to person, place, and time. Mental status is at baseline.      UC Treatments / Results  Labs (all labs ordered are listed, but only abnormal results are displayed) Labs Reviewed - No data to display  EKG   Radiology No results found.  Procedures Procedures (including critical care time)  Medications Ordered in UC Medications - No data to display  Initial Impression / Assessment and Plan / UC Course  I have reviewed the triage vital signs and the nursing notes.  Pertinent labs & imaging results that were available during my care of the patient were reviewed by me and considered in my medical decision making (see chart for details).     MDM: 1.   COVID-19-Rx'd Paxlovid (300/100) 20 x 150 mg & 10 x 100 mg TBPK: Take 3 tablets by mouth 2 times daily for 5 days; 2.  Cough, unspecified type-Rx Tessalon capsules 200 mg 3 times  daily, as needed for cough. Advised patient to take medication as directed with food to completion.  Advised may take Tessalon capsules daily or as needed for cough.  Encouraged to increase daily water intake to 64 ounces per day while taking these medications.  Advised if symptoms worsen and/or unresolved please follow-up PCP or here for further evaluation.  Patient discharged home, hemodynamically stable. Final Clinical Impressions(s) / UC Diagnoses   Final diagnoses:  COVID-19  Cough, unspecified type     Discharge Instructions      Advised patient to take medication as directed with food to completion.  Advised may take Tessalon capsules daily or as needed for cough.  Encouraged to increase daily water intake to 64 ounces per day while taking these medications.  Advised if symptoms worsen and/or unresolved please follow-up PCP or here for further evaluation.     ED Prescriptions     Medication Sig Dispense Auth. Provider   nirmatrelvir & ritonavir (PAXLOVID, 300/100,) 20 x 150 MG & 10 x 100MG  TBPK Take 3 tablets by mouth 2 (two) times daily for 5 days. 30 tablet Trevor Iha, FNP   benzonatate (TESSALON) 200 MG capsule Take 1 capsule (200 mg total) by mouth 3 (three) times daily as needed for up to 7 days. 40 capsule Trevor Iha, FNP      PDMP not reviewed this encounter.   Trevor Iha, FNP 10/28/22 [phone]

## 2022-10-28 NOTE — ED Triage Notes (Signed)
Pt reports went on a family trip and her sister exposed them all to covid. Due to patient's health problems, she took a covid test 7/29 and it was positive. Pt reports had cough, congestion, diarrhea, feeling shakey, decreased appetite, lightheadedness, and at night feels like can't get her breath causing her to have trouble sleeping. Taking Dayquil and Nyquil. Pt states that she quarantined and then re-tested herself on 8/5, which showed still positive.
